# Patient Record
Sex: Female | Born: 2000
Health system: Southern US, Community
[De-identification: ages and names within clinical notes are randomized; demographics above are authoritative.]

## PROBLEM LIST (undated history)

## (undated) DIAGNOSIS — M545 Low back pain: Secondary | ICD-10-CM

## (undated) DIAGNOSIS — M419 Scoliosis, unspecified: Secondary | ICD-10-CM

## (undated) DIAGNOSIS — H7090 Unspecified mastoiditis, unspecified ear: Secondary | ICD-10-CM

## (undated) HISTORY — PX: INGUINAL HERNIA REPAIR: SUR1180

## (undated) HISTORY — DX: Unspecified mastoiditis, unspecified ear: H70.90

## (undated) HISTORY — DX: Low back pain: M54.5

## (undated) HISTORY — DX: Scoliosis, unspecified: M41.9

---
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
## 2020-03-05 ENCOUNTER — Telehealth: Payer: Self-pay

## 2020-03-05 NOTE — Telephone Encounter (Signed)
Patients mother called in needing to get the patient scheduled for some injections before going off to college one she is needing is meningococcal and the Tdap possible    Please advise

## 2020-03-05 NOTE — Telephone Encounter (Signed)
Please schedule her an appointment.  She hasn't been seen since 2019.   Thanks.

## 2020-03-20 ENCOUNTER — Encounter: Payer: Self-pay | Admitting: Family Medicine

## 2020-03-20 ENCOUNTER — Other Ambulatory Visit: Payer: Self-pay

## 2020-03-20 ENCOUNTER — Ambulatory Visit (INDEPENDENT_AMBULATORY_CARE_PROVIDER_SITE_OTHER): Payer: 59 | Admitting: Family Medicine

## 2020-03-20 VITALS — BP 130/82 | HR 82 | Temp 98.3°F | Resp 16 | Ht 71.0 in | Wt 169.6 lb

## 2020-03-20 DIAGNOSIS — Z0000 Encounter for general adult medical examination without abnormal findings: Secondary | ICD-10-CM

## 2020-03-20 DIAGNOSIS — Z23 Encounter for immunization: Secondary | ICD-10-CM | POA: Diagnosis not present

## 2020-03-20 NOTE — Addendum Note (Signed)
Addended by: Emi Holes D on: 03/20/2020 10:45 AM   Modules accepted: Orders

## 2020-03-20 NOTE — Progress Notes (Signed)
Office Note 03/20/2020  CC:  Chief Complaint  Patient presents with  . Annual Exam    pt is not fasting     HPI:  Jacqueline [name] is a 19 y.o. White female who is here accompanied by her mother Kenney Houseman for annual health maintenance exam. Leaving for [place] 04/2020 for college--[employer]ug[employer] scholarship. Exercise science and atheletic training major.  She is excited. 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 No recent injuries. No questions today.   Past Medical History:  Diagnosis Date  . Acute low back pain 2018   Responding to PT well as of 01/2017  . Mastoiditis   . Scoliosis of lumbar spine    mild levoscoliosis on L/S x-ray 11/2016.    Past Surgical History:  Procedure Laterality Date  . INGUINAL HERNIA REPAIR  approx 2010   Right side (Dr. Wyline Mood).    Family History  Problem Relation Age of Onset  . Melanoma Mother   . Lung cancer Maternal Grandmother        smoker  . Breast cancer Paternal Grandmother     Social History   Socioeconomic History  . Marital status: Single    Spouse name: Not on file  . Number of children: Not on file  . Years of education: Not on file  . Highest education level: Not on file  Occupational History  . Not on file  Tobacco Use  . Smoking status: Never Smoker  . Smokeless tobacco: Never Used  Vaping Use  . Vaping Use: Never used  Substance and Sexual Activity  . Alcohol use: No  . Drug use: No  . Sexual activity: Never    Birth control/protection: Abstinence  Other Topics Concern  . Not on file  Social History Narrative   Home schooled.   Lives with M, D, two siblings.   Social Determinants of Health   Financial Resource Strain:   . Difficulty of Paying Living Expenses:   Food Insecurity:   . Worried About Programme researcher, broadcasting/film/video in the Last Year:   . Barista in the Last Year:   Transportation Needs:   . Freight forwarder (Medical):   Marland Kitchen Lack of Transportation (Non-Medical):   Physical Activity:   .  Days of Exercise per Week:   . Minutes of Exercise per Session:   Stress:   . Feeling of Stress :   Social Connections:   . Frequency of Communication with Friends and Family:   . Frequency of Social Gatherings with Friends and Family:   . Attends Religious Services:   . Active Member of Clubs or Organizations:   . Attends Banker Meetings:   Marland Kitchen Marital Status:   Intimate Partner Violence:   . Fear of Current or Ex-Partner:   . Emotionally Abused:   Marland Kitchen Physically Abused:   . Sexually Abused:     Outpatient Medications Prior to Visit  Medication Sig Dispense Refill  . COLLAGEN PO Take by mouth daily.    Marland Kitchen ibuprofen (ADVIL,MOTRIN) 100 MG tablet Take 300 mg by mouth every 6 (six) hours as needed.    . Multiple Vitamin (MULTIVITAMIN PO) Take by mouth daily.     No facility-administered medications prior to visit.    Allergies  Allergen Reactions  . Penicillins Hives    ROS Review of Systems  Constitutional: Negative for appetite change, chills, fatigue and fever.  HENT: Negative for congestion, dental problem, ear pain and sore throat.   Eyes: Negative for discharge,  redness and visual disturbance.  Respiratory: Negative for cough, chest tightness, shortness of breath and wheezing.   Cardiovascular: Negative for chest pain, palpitations and leg swelling.  Gastrointestinal: Negative for abdominal pain, blood in stool, diarrhea, nausea and vomiting.  Genitourinary: Negative for difficulty urinating, dysuria, flank pain, frequency, hematuria and urgency.  Musculoskeletal: Negative for arthralgias, back pain, joint swelling, myalgias and neck stiffness.  Skin: Negative for pallor and rash.  Neurological: Negative for dizziness, speech difficulty, weakness and headaches.  Hematological: Negative for adenopathy. Does not bruise/bleed easily.  Psychiatric/Behavioral: Negative for confusion and sleep disturbance. The patient is not nervous/anxious.     PE; Blood  pressure 130/82, pulse 82, temperature 98.3 F (36.8 C), temperature source Temporal, resp. rate 16, height 5\' 11"  (1.803 m), weight 169 lb 9.6 oz (76.9 kg), last menstrual period 03/08/2020, SpO2 98 %. Body mass index is 23.65 kg/m.  Gen: Alert, well appearing.  Patient is oriented to person, place, time, and situation. AFFECT: pleasant, lucid thought and speech. ENT: Ears: EACs clear, normal epithelium.  TMs with good light reflex and landmarks bilaterally.  Eyes: no injection, icteris, swelling, or exudate.  EOMI, PERRLA. Nose: no drainage or turbinate edema/swelling.  No injection or focal lesion.  Mouth: lips without lesion/swelling.  Oral mucosa pink and moist.  Dentition intact and without obvious caries or gingival swelling.  Oropharynx without erythema, exudate, or swelling.  Neck: supple/nontender.  No LAD, mass, or TM.  Carotid pulses 2+ bilaterally, without bruits. CV: RRR, no m/r/g.   LUNGS: CTA bilat, nonlabored resps, good aeration in all lung fields. ABD: soft, NT, ND, BS normal.  No hepatospenomegaly or mass.  No bruits. EXT: no clubbing, cyanosis, or edema.  Musculoskeletal: no joint swelling, erythema, warmth, or tenderness.  ROM of all joints intact. Skin - no sores or suspicious lesions or rashes or color changes   Pertinent labs:  NONE  ASSESSMENT AND PLAN:   Health maintenance exam: Reviewed age and gender appropriate health maintenance issues (prudent diet, regular exercise, health risks of tobacco and excessive alcohol, use of seatbelts, fire alarms in home, use of sunscreen).  Also reviewed age and gender appropriate health screening as well as vaccine recommendations. Vaccines: menveo (#2) today.  Records show only 1 varicella vaccine.  However, pt and mom state that her school health forms don't show this being a required vaccine but they'll get back to 03/10/2020 if they want her to get varicella vaccine #2 OR test for immunity. Labs: none Cervical ca screening:  recommended start pap/pelvic exams age 79 yrs.  An After Visit Summary was printed and given to the patient.  FOLLOW UP:  Return in about 1 year (around 03/20/2021) for annual physical exam.  Signed:  05/21/2021, MD           03/20/2020

## 2020-03-20 NOTE — Patient Instructions (Signed)
Health Maintenance, Female Adopting a healthy lifestyle and getting preventive care are important in promoting health and wellness. Ask your health care provider about:  The right schedule for you to have regular tests and exams.  Things you can do on your own to prevent diseases and keep yourself healthy. What should I know about diet, weight, and exercise? Eat a healthy diet   Eat a diet that includes plenty of vegetables, fruits, low-fat dairy products, and lean protein.  Do not eat a lot of foods that are high in solid fats, added sugars, or sodium. Maintain a healthy weight Body mass index (BMI) is used to identify weight problems. It estimates body fat based on height and weight. Your health care provider can help determine your BMI and help you achieve or maintain a healthy weight. Get regular exercise Get regular exercise. This is one of the most important things you can do for your health. Most adults should:  Exercise for at least 150 minutes each week. The exercise should increase your heart rate and make you sweat (moderate-intensity exercise).  Do strengthening exercises at least twice a week. This is in addition to the moderate-intensity exercise.  Spend less time sitting. Even light physical activity can be beneficial. Watch cholesterol and blood lipids Have your blood tested for lipids and cholesterol at 19 years of age, then have this test every 5 years. Have your cholesterol levels checked more often if:  Your lipid or cholesterol levels are high.  You are older than 19 years of age.  You are at high risk for heart disease. What should I know about cancer screening? Depending on your health history and family history, you may need to have cancer screening at various ages. This may include screening for:  Breast cancer.  Cervical cancer.  Colorectal cancer.  Skin cancer.  Lung cancer. What should I know about heart disease, diabetes, and high blood  pressure? Blood pressure and heart disease  High blood pressure causes heart disease and increases the risk of stroke. This is more likely to develop in people who have high blood pressure readings, are of African descent, or are overweight.  Have your blood pressure checked: ? Every 3-5 years if you are 19-39 years of age. ? Every year if you are 40 years old or older. Diabetes Have regular diabetes screenings. This checks your fasting blood sugar level. Have the screening done:  Once every three years after age 40 if you are at a normal weight and have a low risk for diabetes.  More often and at a younger age if you are overweight or have a high risk for diabetes. What should I know about preventing infection? Hepatitis B If you have a higher risk for hepatitis B, you should be screened for this virus. Talk with your health care provider to find out if you are at risk for hepatitis B infection. Hepatitis C Testing is recommended for:  Everyone born from 1945 through 1965.  Anyone with known risk factors for hepatitis C. Sexually transmitted infections (STIs)  Get screened for STIs, including gonorrhea and chlamydia, if: ? You are sexually active and are younger than 19 years of age. ? You are older than 19 years of age and your health care provider tells you that you are at risk for this type of infection. ? Your sexual activity has changed since you were last screened, and you are at increased risk for chlamydia or gonorrhea. Ask your health care provider if   you are at risk.  Ask your health care provider about whether you are at high risk for HIV. Your health care provider may recommend a prescription medicine to help prevent HIV infection. If you choose to take medicine to prevent HIV, you should first get tested for HIV. You should then be tested every 3 months for as long as you are taking the medicine. Pregnancy  If you are about to stop having your period (premenopausal) and  you may become pregnant, seek counseling before you get pregnant.  Take 400 to 800 micrograms (mcg) of folic acid every day if you become pregnant.  Ask for birth control (contraception) if you want to prevent pregnancy. Osteoporosis and menopause Osteoporosis is a disease in which the bones lose minerals and strength with aging. This can result in bone fractures. If you are 65 years old or older, or if you are at risk for osteoporosis and fractures, ask your health care provider if you should:  Be screened for bone loss.  Take a calcium or vitamin D supplement to lower your risk of fractures.  Be given hormone replacement therapy (HRT) to treat symptoms of menopause. Follow these instructions at home: Lifestyle  Do not use any products that contain nicotine or tobacco, such as cigarettes, e-cigarettes, and chewing tobacco. If you need help quitting, ask your health care provider.  Do not use street drugs.  Do not share needles.  Ask your health care provider for help if you need support or information about quitting drugs. Alcohol use  Do not drink alcohol if: ? Your health care provider tells you not to drink. ? You are pregnant, may be pregnant, or are planning to become pregnant.  If you drink alcohol: ? Limit how much you use to 0-1 drink a day. ? Limit intake if you are breastfeeding.  Be aware of how much alcohol is in your drink. In the [place] one drink equals one 12 oz bottle of beer (355 mL), one 5 oz glass of wine (148 mL), or one 1 oz glass of hard liquor (44 mL). General instructions  Schedule regular health, dental, and eye exams.  Stay current with your vaccines.  Tell your health care provider if: ? You often feel depressed. ? You have ever been abused or do not feel safe at home. Summary  Adopting a healthy lifestyle and getting preventive care are important in promoting health and wellness.  Follow your health care provider's instructions about healthy  diet, exercising, and getting tested or screened for diseases.  Follow your health care provider's instructions on monitoring your cholesterol and blood pressure. This information is not intended to replace advice given to you by your health care provider. Make sure you discuss any questions you have with your health care provider. Document Revised: 08/24/2018 Document Reviewed: 08/24/2018 Elsevier Patient Education  2020 Elsevier Inc.  

## 2020-03-27 ENCOUNTER — Telehealth: Payer: Self-pay

## 2020-03-27 NOTE — Telephone Encounter (Signed)
OK, as long as the only symptoms are hives at/around the injection site then the only treatment necessary is 25mg  benadryl every 6 hours as needed.  If other sx's such as wheezing, SOB, n/v/d, or swelling in face/tongue/throat then let me know.-thx

## 2020-03-27 NOTE — Telephone Encounter (Signed)
Spoke with patient's mother, okay per dpr. Patient is experiencing swelling in her fingers and toes. She is also still feeling itchy all over and has been taking benadyrl. Any other suggestions? Please advise, thanks.

## 2020-03-27 NOTE — Telephone Encounter (Signed)
Mom states that daughter has hives and rash at injection site. She was seen last week meningococcal injection.  Please call mom [phone]

## 2020-03-27 NOTE — Telephone Encounter (Signed)
Patient was seen for CPE last week and given menveo. She is having reaction to injection.   Please advise, thanks.

## 2020-03-27 NOTE — Telephone Encounter (Signed)
Nothing else to add, unfortunately.

## 2020-03-28 NOTE — Telephone Encounter (Signed)
Pt's mom, Archie Patten advised.

## 2020-04-26 ENCOUNTER — Telehealth: Payer: Self-pay | Admitting: Family Medicine

## 2020-04-26 NOTE — Telephone Encounter (Signed)
Form given to PCP for completion.

## 2020-04-26 NOTE — Telephone Encounter (Signed)
Patient's mom emailed me a form for college, states that she did not have with her at the physical and was told she would be able to bring them by to be filled out. Mom does state that she is needing it by Monday afternoon if at all possible. Forms have been placed in folder up front

## 2020-04-29 NOTE — Telephone Encounter (Signed)
Mom called about sports physical form for college that was left last week.  It is ready for pick up.  She will come by office to pick up soon.

## 2020-04-29 NOTE — Telephone Encounter (Signed)
Signed and put in box to go up front. Signed:  Santiago Bumpers, MD           04/29/2020

## 2020-08-06 ENCOUNTER — Telehealth: Payer: Self-pay

## 2020-08-06 NOTE — Telephone Encounter (Signed)
-----   Message from Jeoffrey Massed, MD sent at 08/06/2020 12:57 PM EST ----- Regarding: RE: COVID Vaccine Yes, ok to get covid vaccine. ----- Message ----- From: Maxie Barb, CMA Sent: 08/06/2020   8:19 AM EST To: Jeoffrey Massed, MD Subject: COVID Vaccine                                  Pt mother would like to know if it is safe for pt to receive COVID vaccine pt had a reaction with hand swelling and elevated HR with meningococcal vaccine. Pt do not normally receive flu vaccine and had not had any reaction to any other vaccines in the past.

## 2020-08-06 NOTE — Telephone Encounter (Signed)
Pt mother aware.

## 2020-08-26 ENCOUNTER — Encounter: Payer: Self-pay | Admitting: Family Medicine

## 2020-08-26 ENCOUNTER — Telehealth (INDEPENDENT_AMBULATORY_CARE_PROVIDER_SITE_OTHER): Payer: 59 | Admitting: Family Medicine

## 2020-08-26 VITALS — Temp 100.0°F | Ht 71.0 in | Wt 170.0 lb

## 2020-08-26 DIAGNOSIS — R059 Cough, unspecified: Secondary | ICD-10-CM

## 2020-08-26 DIAGNOSIS — R5081 Fever presenting with conditions classified elsewhere: Secondary | ICD-10-CM

## 2020-08-26 DIAGNOSIS — J209 Acute bronchitis, unspecified: Secondary | ICD-10-CM | POA: Diagnosis not present

## 2020-08-26 DIAGNOSIS — J069 Acute upper respiratory infection, unspecified: Secondary | ICD-10-CM

## 2020-08-26 DIAGNOSIS — G933 Postviral fatigue syndrome: Secondary | ICD-10-CM

## 2020-08-26 DIAGNOSIS — R062 Wheezing: Secondary | ICD-10-CM | POA: Diagnosis not present

## 2020-08-26 DIAGNOSIS — G9331 Postviral fatigue syndrome: Secondary | ICD-10-CM

## 2020-08-26 MED ORDER — ALBUTEROL SULFATE HFA 108 (90 BASE) MCG/ACT IN AERS: 2.0000 | INHALATION_SPRAY | RESPIRATORY_TRACT | 0 refills | Status: DC | PRN

## 2020-08-26 MED ORDER — PREDNISONE 20 MG PO TABS: ORAL_TABLET | ORAL | 0 refills | Status: DC

## 2020-08-26 NOTE — Progress Notes (Signed)
Virtual Visit via Video Note  I connected with pt on 08/26/20 at  4:00 PM EST by a video enabled telemedicine application and verified that I am speaking with the correct person using two identifiers.  Location patient: home,  Location provider:work or home office Persons participating in the virtual visit: patient, provider  I discussed the limitations of evaluation and management by telemedicine and the availability of in person appointments. The patient expressed understanding and agreed to proceed.   HPI: 19 y/o WF being seen today for cough. Describes influenza-like illness a few months ago, had cough since that time---sounds like bronchospastic-type cough from what she describes.  Worse when trying to run/play sports.  ? Tight chest intermittently. Now signif Inc cough x 2-3d, then last night eyes hurting, headache, stuffy and runny nose, ST (she thinks only from drainage).  T 102 last night.  T100 this morning, T 100 now.  No SOB or DOE or CP.  No n/v/d. Taking mucinex plain and tylenol. More coughing when deep breathing and lying down.   ROS: See pertinent positives and negatives per HPI.  Past Medical History:  Diagnosis Date  . Acute low back pain 2018   Responding to PT well as of 01/2017  . Mastoiditis   . Scoliosis of lumbar spine    mild levoscoliosis on L/S x-ray 11/2016.    Past Surgical History:  Procedure Laterality Date  . INGUINAL HERNIA REPAIR  approx 2010   Right side (Dr. Wyline Mood).     Current Outpatient Medications:  .  COLLAGEN PO, Take by mouth daily., Disp: , Rfl:  .  Multiple Vitamin (MULTIVITAMIN PO), Take by mouth daily., Disp: , Rfl:  .  ibuprofen (ADVIL,MOTRIN) 100 MG tablet, Take 300 mg by mouth every 6 (six) hours as needed. (Patient not taking: Reported on 08/26/2020), Disp: , Rfl:   EXAM:  VITALS per patient if applicable:  Vitals with BMI 08/26/2020 03/20/2020 11/23/2017  Height 5\' 11"  5\' 11"  5\' 10"   Weight 170 lbs 169 lbs 10 oz 155 lbs 4  oz  BMI 23.72 23.66 22.28  Systolic - 130 125  Diastolic - 82 63  Pulse - 82 66  Temp today is 100.   GENERAL: alert, oriented, appears well and in no acute distress  HEENT: atraumatic, conjunttiva clear, no obvious abnormalities on inspection of external nose and ears Very stuffy nose, sniffling a lot.  NECK: normal movements of the head and neck  LUNGS: on inspection no signs of respiratory distress, breathing rate appears normal, no obvious gross SOB, gasping or wheezing  CV: no obvious cyanosis  MS: moves all visible extremities without noticeable abnormality  PSYCH/NEURO: pleasant and cooperative, no obvious depression or anxiety, speech and thought processing grossly intact  ASSESSMENT AND PLAN:  Discussed the following assessment and plan:  Postinfectious/postinflammatory RAD/cough--->now with superimposed acute URI with cough/congestion and fever. Will treat with prednisone 40mg  qd x 5d, then 20mg  qd x 5d. Also, albuterol HFA 2p q4h prn. Continue mucinex, start saline nasal spray, continue tylenol or motrin q6h. Rest, fluids, quarantine as appropriate.  -we discussed possible serious and likely etiologies, options for evaluation and workup, limitations of telemedicine visit vs in person visit, treatment, treatment risks and precautions. Pt prefers to treat via telemedicine empirically rather than in person at this moment.    I discussed the assessment and treatment plan with the patient. The patient was provided an opportunity to ask questions and all were answered. The patient agreed with the plan and  demonstrated an understanding of the instructions.    F/u: 3-4 wks, mainly to discuss any ongoing w/u and /or mgmt for her postinfectious/postinflamm cough/RAD.  Signed:  Santiago Bumpers, MD           08/26/2020

## 2020-08-28 ENCOUNTER — Telehealth: Payer: Self-pay | Admitting: Family Medicine

## 2020-08-28 MED ORDER — BENZONATATE 200 MG PO CAPS: ORAL_CAPSULE | ORAL | 0 refills | Status: DC

## 2020-08-28 MED ORDER — AZITHROMYCIN 250 MG PO TABS: ORAL_TABLET | ORAL | 0 refills | Status: DC

## 2020-08-28 NOTE — Telephone Encounter (Signed)
Patient's mother states Jacqueline [name] still has fever,  wheezing and coughing to the point of vomiting. Patient has been getting the inhaler and the prednisone. Mother says it sounds like infection going into the patient's lungs and thinks she needs an antibiotic. Please call patient's mother to advise.

## 2020-08-28 NOTE — Telephone Encounter (Signed)
Spoke with patient's mother and advised of instructions/recommendations.

## 2020-08-28 NOTE — Telephone Encounter (Signed)
Reassure. I agree that she has infection-->very likely a virus BUT I have sent just sent in abx to see if this helps. Also, I just rx'd tessalon pearls for cough---ok to stop mucinex. No other changes.

## 2020-08-28 NOTE — Telephone Encounter (Signed)
Patient was last seen 12/13, given the following instructions: "Will treat with prednisone 40mg  qd x 5d, then 20mg  qd x 5d. Also, albuterol HFA 2p q4h prn. Continue mucinex, start saline nasal spray, continue tylenol or motrin q6h. Rest, fluids, quarantine as appropriate". Spoke with mom, she is still running 99-100 low grade fever. Lungs hurt, burning and rattling in the chest. They have doing everything recommended and still taking medications as prescribed as well but not working well. She has not slept in 3 days  Please advise, thanks.

## 2020-09-16 ENCOUNTER — Encounter: Payer: Self-pay | Admitting: Family Medicine

## 2020-09-16 ENCOUNTER — Encounter: Payer: 59 | Admitting: Family Medicine

## 2020-09-16 NOTE — Progress Notes (Deleted)
Virtual Visit via Video Note  I connected with pt on 09/16/20 at  8:30 AM EST by a video enabled telemedicine application and verified that I am speaking with the correct person using two identifiers.  Location patient: home, [place] Location provider:work or home office Persons participating in the virtual visit: patient, provider  I discussed the limitations of evaluation and management by telemedicine and the availability of in person appointments. The patient expressed understanding and agreed to proceed.  Telemedicine visit is a necessity given the COVID-19 restrictions in place at the current time.  HPI: 20 y/o WF being seen today for f/u cough. I last saw her almost 3 wks ago: A/P as of that visit: "Postinfectious/postinflammatory RAD/cough--->now with superimposed acute URI with cough/congestion and fever. Will treat with prednisone 40mg  qd x 5d, then 20mg  qd x 5d. Also, albuterol HFA 2p q4h prn. Continue mucinex, start saline nasal spray, continue tylenol or motrin q6h. Rest, fluids, quarantine as appropriate."  INTERIM HX: I ended up calling in abx a couple days after I saw her last. ***  ROS: See pertinent positives and negatives per HPI.  Past Medical History:  Diagnosis Date  . Acute low back pain 2018   Responding to PT well as of 01/2017  . Mastoiditis   . Scoliosis of lumbar spine    mild levoscoliosis on L/S x-ray 11/2016.    Past Surgical History:  Procedure Laterality Date  . INGUINAL HERNIA REPAIR  approx 2010   Right side (Dr. 12/2016).     Current Outpatient Medications:  .  albuterol (VENTOLIN HFA) 108 (90 Base) MCG/ACT inhaler, Inhale 2 puffs into the lungs every 4 (four) hours as needed for wheezing or shortness of breath., Disp: 1 each, Rfl: 0 .  azithromycin (ZITHROMAX) 250 MG tablet, 2 tabs po qd x 1d, then 1 tab po qd x 4d, Disp: 6 tablet, Rfl: 0 .  benzonatate (TESSALON) 200 MG capsule, 1 cap po tid prn cough, Disp: 20 capsule, Rfl: 0 .  COLLAGEN  PO, Take by mouth daily., Disp: , Rfl:  .  ibuprofen (ADVIL,MOTRIN) 100 MG tablet, Take 300 mg by mouth every 6 (six) hours as needed. (Patient not taking: Reported on 08/26/2020), Disp: , Rfl:  .  Multiple Vitamin (MULTIVITAMIN PO), Take by mouth daily., Disp: , Rfl:  .  predniSONE (DELTASONE) 20 MG tablet, 2 tabs po qd x 5d, then 1 tab po qd x 5d, Disp: 15 tablet, Rfl: 0  EXAM:  VITALS per patient if applicable:  GENERAL: alert, oriented, appears well and in no acute distress  HEENT: atraumatic, conjunttiva clear, no obvious abnormalities on inspection of external nose and ears  NECK: normal movements of the head and neck  LUNGS: on inspection no signs of respiratory distress, breathing rate appears normal, no obvious gross SOB, gasping or wheezing  CV: no obvious cyanosis  MS: moves all visible extremities without noticeable abnormality  PSYCH/NEURO: pleasant and cooperative, no obvious depression or anxiety, speech and thought processing grossly intact  LABS: none today  ASSESSMENT AND PLAN:  Discussed the following assessment and plan:  No diagnosis found.  I discussed the assessment and treatment plan with the patient. The patient was provided an opportunity to ask questions and all were answered. The patient agreed with the plan and demonstrated an understanding of the instructions.     Signed:  Wyline Mood, MD           09/16/2020

## 2021-06-26 ENCOUNTER — Encounter: Payer: Self-pay | Admitting: Family Medicine

## 2021-06-26 ENCOUNTER — Ambulatory Visit: Payer: 59 | Admitting: Family Medicine

## 2021-06-26 ENCOUNTER — Other Ambulatory Visit: Payer: Self-pay

## 2021-06-26 VITALS — BP 108/75 | HR 79 | Temp 97.6°F | Ht 71.0 in | Wt 174.4 lb

## 2021-06-26 DIAGNOSIS — F411 Generalized anxiety disorder: Secondary | ICD-10-CM | POA: Diagnosis not present

## 2021-06-26 DIAGNOSIS — F4323 Adjustment disorder with mixed anxiety and depressed mood: Secondary | ICD-10-CM | POA: Diagnosis not present

## 2021-06-26 MED ORDER — LORAZEPAM 0.5 MG PO TABS: 0.5000 mg | ORAL_TABLET | Freq: Two times a day (BID) | ORAL | 0 refills | Status: DC | PRN

## 2021-06-26 MED ORDER — DULOXETINE HCL 20 MG PO CPEP: 20.0000 mg | ORAL_CAPSULE | Freq: Every day | ORAL | 0 refills | Status: DC

## 2021-06-26 NOTE — Progress Notes (Signed)
OFFICE VISIT  06/26/2021  CC:  Chief Complaint  Patient presents with   Anxiety    Accompanied by mother   HPI:    Patient is a 20 y.o. female who presents accompanied by her mother Archie Patten for anxiety.  INTERIM HX: Unfortunately Katherine's mom and dad were in a bad motor vehicle accident 3 months ago.  Her father was killed and her mother was severely injured but is recovering. She has always struggled with generalized anxiety but has always been able to cope adequately in the past.  She seeks care today because the last few months have been extremely hard, understandably.  Says she feels overwhelmed. She has anxiety to the point of being unable to push herself to do anything at all.  Afraid to go out, does not eat, is sleeping maybe 2 hours a night, often feels a sense of panic.  Describes her mood as sad, but then says not depressed.  Her close friends are back and is very but her family is close and she has good support. She tried to go back to school this fall in [place] but did not last long because she could not function with the responsibilities of school, work, and playing rugby.  She has never been on any medication for anxiety or depression. She has an appointment set up with a counselor through [facility] behavioral [facility] but it is about a month from now and she is hoping I can help her get 1 was someone earlier.  Past Medical History:  Diagnosis Date   Acute low back pain 2018   Responding to PT well as of 01/2017   Mastoiditis    Scoliosis of lumbar spine    mild levoscoliosis on L/S x-ray 11/2016.    Past Surgical History:  Procedure Laterality Date   INGUINAL HERNIA REPAIR  approx 2010   Right side (Dr. Wyline Mood).    Outpatient Medications Prior to Visit  Medication Sig Dispense Refill   ibuprofen (ADVIL,MOTRIN) 100 MG tablet Take 300 mg by mouth every 6 (six) hours as needed.     Multiple Vitamin (MULTIVITAMIN PO) Take by mouth daily.     albuterol (VENTOLIN HFA)  108 (90 Base) MCG/ACT inhaler Inhale 2 puffs into the lungs every 4 (four) hours as needed for wheezing or shortness of breath. (Patient not taking: Reported on 06/26/2021) 1 each 0   benzonatate (TESSALON) 200 MG capsule 1 cap po tid prn cough (Patient not taking: Reported on 06/26/2021) 20 capsule 0   COLLAGEN PO Take by mouth daily. (Patient not taking: Reported on 06/26/2021)     predniSONE (DELTASONE) 20 MG tablet 2 tabs po qd x 5d, then 1 tab po qd x 5d (Patient not taking: Reported on 06/26/2021) 15 tablet 0   No facility-administered medications prior to visit.    Allergies  Allergen Reactions   Penicillins Hives    ROS As per HPI  PE: Vitals with BMI 06/26/2021 08/26/2020 03/20/2020  Height 5\' 11"  5\' 11"  5\' 11"   Weight 174 lbs 6 oz 170 lbs 169 lbs 10 oz  BMI 24.33 23.72 23.66  Systolic 108 - 130  Diastolic 75 - 82  Pulse 79 - 82    General: Alert, attends well.  She is blunted, sad.  Not crying.  Often looks to her mother to help her answer questions. No further exam today  LABS:  none  IMPRESSION AND PLAN:  GAD, with superimposed adjustment disorder with mixed anxious and depressed mood.  She has  panic attacks.  We discussed medications in depth today and she was quite apprehensive about them, particularly antidepressants.  We talked through the pros and cons and in the end she decided to go ahead and make a plan to start meds and see how things go. Prescribed duloxetine 20 mg, 1/day, #30, no refill. Prescribed lorazepam 0.5 mg, 1 twice daily as needed for severe anxiety, #30, no refill. Therapeutic expectations and side effect profile of medication discussed today.  Patient's questions answered. I have ordered behavioral health evaluation, focusing efforts on trying to get her an appointment within the next 2 weeks.  I think just having this appointment in the near future, rather than a month away, will give her some optimism towards getting better.  Spent 30 min with  pt today reviewing HPI, reviewing relevant past history, doing exam, reviewing and discussing lab and imaging data, and formulating plans.   An After Visit Summary was printed and given to the patient.  FOLLOW UP: No follow-ups on file.  Signed:  Santiago Bumpers, MD           06/26/2021

## 2021-06-30 ENCOUNTER — Telehealth (INDEPENDENT_AMBULATORY_CARE_PROVIDER_SITE_OTHER): Payer: 59 | Admitting: Psychiatry

## 2021-06-30 ENCOUNTER — Encounter (HOSPITAL_COMMUNITY): Payer: Self-pay | Admitting: Psychiatry

## 2021-06-30 DIAGNOSIS — F4321 Adjustment disorder with depressed mood: Secondary | ICD-10-CM

## 2021-06-30 DIAGNOSIS — F321 Major depressive disorder, single episode, moderate: Secondary | ICD-10-CM

## 2021-06-30 DIAGNOSIS — F410 Panic disorder [episodic paroxysmal anxiety] without agoraphobia: Secondary | ICD-10-CM

## 2021-06-30 MED ORDER — BUSPIRONE HCL 7.5 MG PO TABS: 7.5000 mg | ORAL_TABLET | Freq: Every day | ORAL | 0 refills | Status: DC

## 2021-06-30 MED ORDER — MIRTAZAPINE 7.5 MG PO TABS
7.5000 mg | ORAL_TABLET | Freq: Every day | ORAL | 0 refills | Status: DC
Start: 2021-06-30 — End: 2021-07-18

## 2021-06-30 NOTE — Progress Notes (Signed)
Psychiatric Initial Adult Assessment   Patient Identification: Jacqueline [name] MRN:  [id number] Date of Evaluation:  06/30/2021 Referral Source: Primary care Chief Complaint:  establish care, anxiety, grief Visit Diagnosis:    ICD-10-CM   1. Current moderate episode of major depressive disorder without prior episode (HCC)  F32.1     2. Panic attacks  F41.0     3. Grief  F43.21      Virtual Visit via Video Note  I connected with Jacqueline [name] on 06/30/21 at  2:00 PM EDT by a video enabled telemedicine application and verified that I am speaking with the correct person using two identifiers.  Location: Patient: home Provider: home office   I discussed the limitations of evaluation and management by telemedicine and the availability of in person appointments. The patient expressed understanding and agreed to proceed.      I discussed the assessment and treatment plan with the patient. The patient was provided an opportunity to ask questions and all were answered. The patient agreed with the plan and demonstrated an understanding of the instructions.   The patient was advised to call back or seek an in-person evaluation if the symptoms worsen or if the condition fails to improve as anticipated.  I provided 45 minutes of non-face-to-face time during this encounter.   Thresa Ross, MD  History of Present Illness: Patient is a 20 years old currently single Caucasian female referred by primary care physician office to establish care for anxiety she currently lives with her sister mom and brother.  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  Her dad was a IT sales professional it was a Primary school teacher.  She adopted and did not have the process to go through grief considering her mom was in ICU as well she developed fear and anxiety panic-like attacks when she would go out and recently has also been feeling down subdued decreased appetite poor sleep.  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  There is no associated psychotic symptoms or manic symptoms  Aggravating factor; dad death in accdient,  parents accident. Modifying factors; her sister, her brother  Duration since July this year   Past Psychiatric History: denies  Previous Psychotropic Medications: No   Substance Abuse History in the last 12 months:  No.  Consequences of Substance Abuse: NA  Past Medical History:  Past Medical History:  Diagnosis Date   Acute low back pain 2018   Responding  to PT well as of 01/2017   Mastoiditis    Scoliosis of lumbar spine    mild levoscoliosis on L/S x-ray 11/2016.    Past Surgical History:  Procedure Laterality Date   INGUINAL HERNIA REPAIR  approx 2010   Right side (Dr. Wyline Mood).    Family Psychiatric History: substance use among family, aunt, uncle  Family History:  Family History  Problem Relation Age of Onset   Melanoma Mother    Lung cancer Maternal Grandmother        smoker   Breast cancer Paternal Grandmother     Social History:   Social History    Socioeconomic History   Marital status: Single    Spouse name: Not on file   Number of children: Not on file   Years of education: Not on file   Highest education level: Not on file  Occupational History   Not on file  Tobacco Use   Smoking status: Never   Smokeless tobacco: Never  Vaping Use   Vaping Use: Never used  Substance and Sexual Activity   Alcohol use: No   Drug use: No   Sexual activity: Never    Birth control/protection: Abstinence  Other Topics Concern   Not on file  Social History Narrative   Home schooled.   Lives with M, D, two siblings.   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 30 tablet 0   mirtazapine (REMERON) 7.5 MG tablet Take 1 tablet (7.5 mg total) by mouth at bedtime. 30 tablet 0   albuterol (VENTOLIN HFA) 108 (90 Base) MCG/ACT inhaler Inhale 2 puffs into the lungs every 4 (four) hours as needed for wheezing or shortness of breath. (Patient not taking: Reported on 06/26/2021) 1 each 0   DULoxetine (CYMBALTA) 20 MG capsule Take 1 capsule (20 mg total) by mouth daily. 30 capsule 0   ibuprofen (ADVIL,MOTRIN) 100  MG tablet Take 300 mg by mouth every 6 (six) hours as needed.     LORazepam (ATIVAN) 0.5 MG tablet Take 1 tablet (0.5 mg total) by mouth 2 (two) times daily as needed for anxiety. 30 tablet 0   Multiple Vitamin (MULTIVITAMIN PO) Take by mouth daily.     No current facility-administered medications for this visit.     Psychiatric Specialty Exam: Review of Systems  Cardiovascular:  Negative for chest pain.  Psychiatric/Behavioral:  Positive for dysphoric mood and sleep disturbance. Negative for self-injury. The patient is nervous/anxious.    There were no vitals taken for this visit.There is no height or weight on file to calculate BMI.  General Appearance: Casual  Eye Contact:  Fair  Speech:  Normal Rate  Volume:  Decreased  Mood:   subdued  Affect:  Constricted  Thought Process:  Goal Directed  Orientation:  Full (Time, Place, and Person)  Thought Content:  Rumination  Suicidal Thoughts:  No  Homicidal Thoughts:  No  Memory:  Recent;   Fair  Judgement:  Intact  Insight:  Present  Psychomotor Activity:  Decreased  Concentration:  Concentration: Fair  Recall:  Good  Fund of Knowledge:Good  Language: Good  Akathisia:  No  Handed:    AIMS (if indicated):  not done  Assets:  Architect Housing Physical Health  ADL's:  Intact  Cognition: WNL  Sleep:   variable to fair45   Screenings: GAD-7    Flowsheet Row Office Visit from 06/26/2021 in [facility]  Total GAD-7 Score 21      PHQ2-9    Flowsheet Row Video Visit from 06/30/2021 in [facility] [place] Office Visit from 06/26/2021 in [facility] Visit from 03/20/2020 in [facility] Total Score 2 4 0  PHQ-9 Total Score 11 22 --      Flowsheet Row Video Visit from 06/30/2021 in [facility] [place]  C-SSRS RISK CATEGORY No Risk        Assessment and Plan: As follows  Major depressive disorder moderate to severe with no prior episode; she has started Cymbalta 20 mg she can continue I will add Remeron a small dose of 7.5 mg to help with sleep and also for depression considering her losing appetite as well  Anxiety disorder; panic attacks; continue Cymbalta 20 mg add BuSpar 7.5 mg as she is reluctant to take Ativan and she understands the risk.  She is going to start therapy with Fernand Parkins tomorrow highly encouraged to process her grief and anxiety with the therapist  Grief; ongoing grief with depressive symptoms as above continue Cymbalta add Remeron and therapy she is scheduled for tomorrow  She does have a good support system with her sister and is still taking care of her mom who was in a wheelchair and has had broken bones collapsed lung and brain damage   Follow-up in 3 weeks or earlier if needed reviewed medications provided supportive therapy Thresa Ross, MD 10/17/20222:29 PM

## 2021-07-18 ENCOUNTER — Encounter (HOSPITAL_COMMUNITY): Payer: Self-pay | Admitting: Psychiatry

## 2021-07-18 ENCOUNTER — Telehealth (INDEPENDENT_AMBULATORY_CARE_PROVIDER_SITE_OTHER): Payer: 59 | Admitting: Psychiatry

## 2021-07-18 DIAGNOSIS — F4321 Adjustment disorder with depressed mood: Secondary | ICD-10-CM

## 2021-07-18 DIAGNOSIS — F410 Panic disorder [episodic paroxysmal anxiety] without agoraphobia: Secondary | ICD-10-CM

## 2021-07-18 DIAGNOSIS — F321 Major depressive disorder, single episode, moderate: Secondary | ICD-10-CM | POA: Diagnosis not present

## 2021-07-18 MED ORDER — DULOXETINE HCL 20 MG PO CPEP: 20.0000 mg | ORAL_CAPSULE | Freq: Every day | ORAL | 1 refills | Status: DC

## 2021-07-18 MED ORDER — MIRTAZAPINE 7.5 MG PO TABS: 7.5000 mg | ORAL_TABLET | Freq: Every day | ORAL | 1 refills | Status: DC

## 2021-07-18 MED ORDER — BUSPIRONE HCL 7.5 MG PO TABS: 7.5000 mg | ORAL_TABLET | Freq: Every day | ORAL | 1 refills | Status: DC

## 2021-07-18 NOTE — Progress Notes (Signed)
[facility] Follow up visit  Patient Identification: Jacqueline [name] MRN:  [id number] Date of Evaluation:  07/18/2021 Referral Source: Primary care Chief Complaint:  follow up anxiety, grief Visit Diagnosis:    ICD-10-CM   1. Current moderate episode of major depressive disorder without prior episode (HCC)  F32.1     2. Panic attacks  F41.0     3. Grief  F43.21      Virtual Visit via Video Note  I connected with Jacqueline [name] on 07/18/21 at  9:00 AM EDT by a video enabled telemedicine application and verified that I am speaking with the correct person using two identifiers.  Location: Patient: home Provider: home office   I discussed the limitations of evaluation and management by telemedicine and the availability of in person appointments. The patient expressed understanding and agreed to proceed.     I discussed the assessment and treatment plan with the patient. The patient was provided an opportunity to ask questions and all were answered. The patient agreed with the plan and demonstrated an understanding of the instructions.   The patient was advised to call back or seek an in-person evaluation if the symptoms worsen or if the condition fails to improve as anticipated.  I provided 10 minutes of non-face-to-face time during this encounter.     History of Present Illness: Patient is a 20 years old currently single Caucasian female referred initially by primary care physician office to establish care for anxiety she currently lives with her sister mom and brother.  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    Past Surgical History:  Procedure Laterality Date   INGUINAL HERNIA REPAIR  approx 2010   Right side (Dr. Wyline Mood).    Family Psychiatric History: substance use among family, aunt, uncle  Family History:  Family History  Problem Relation Age of Onset   Melanoma Mother    Lung cancer Maternal Grandmother        smoker   Breast cancer Paternal Grandmother     Social History:   Social History   Socioeconomic History   Marital status: Single    Spouse name: Not on file   Number of children: Not on file   Years of education: Not on file   Highest education level: Not on file  Occupational History   Not on file  Tobacco Use   Smoking status: Never  Smokeless tobacco: Never  Vaping Use   Vaping Use: Never used  Substance and Sexual Activity   Alcohol use: No   Drug use: No   Sexual activity: Never    Birth control/protection: Abstinence  Other Topics Concern   Not on file  Social History Narrative   Home schooled.   Lives with M, D, two siblings.   Social Determinants of Health   Financial Resource Strain: Not on file  Food Insecurity: Not on file  Transportation Needs: Not on file  Physical Activity: Not on file  Stress: Not on file  Social Connections: Not on file     Allergies:   Allergies  Allergen Reactions   Penicillins Hives    Metabolic Disorder  Labs: No results found for: HGBA1C, MPG No results found for: PROLACTIN No results found for: CHOL, TRIG, HDL, CHOLHDL, VLDL, LDLCALC No results found for: TSH  Therapeutic Level Labs: No results found for: LITHIUM No results found for: CBMZ No results found for: VALPROATE  Current Medications: Current Outpatient Medications  Medication Sig Dispense Refill   albuterol (VENTOLIN HFA) 108 (90 Base) MCG/ACT inhaler Inhale 2 puffs into the lungs every 4 (four) hours as needed for wheezing or shortness of breath. (Patient not taking: Reported on 06/26/2021) 1 each 0   busPIRone (BUSPAR) 7.5 MG tablet Take 1 tablet (7.5 mg total) by mouth daily. 30 tablet 1   DULoxetine (CYMBALTA) 20 MG capsule Take 1 capsule (20 mg total) by mouth daily. 30 capsule 1   ibuprofen (ADVIL,MOTRIN) 100 MG tablet Take 300 mg by mouth every 6 (six) hours as needed.     LORazepam (ATIVAN) 0.5 MG tablet Take 1 tablet (0.5 mg total) by mouth 2 (two) times daily as needed for anxiety. 30 tablet 0   mirtazapine (REMERON) 7.5 MG tablet Take 1 tablet (7.5 mg total) by mouth at bedtime. 30 tablet 1   Multiple Vitamin (MULTIVITAMIN PO) Take by mouth daily.     No current facility-administered medications for this visit.     Psychiatric Specialty Exam: Review of Systems  Cardiovascular:  Negative for chest pain.  Psychiatric/Behavioral:  Positive for sleep disturbance. Negative for self-injury. The patient is nervous/anxious.    There were no vitals taken for this visit.There is no height or weight on file to calculate BMI.  General Appearance: Casual  Eye Contact:  Fair  Speech:  Normal Rate  Volume:  Decreased  Mood: better  Affect:  Constricted  Thought Process:  Goal Directed  Orientation:  Full (Time, Place, and Person)  Thought Content:  Rumination  Suicidal Thoughts:  No  Homicidal Thoughts:  No  Memory:  Recent;   Fair  Judgement:  Intact  Insight:  Present  Psychomotor Activity:  Decreased   Concentration:  Concentration: Fair  Recall:  Good  Fund of Knowledge:Good  Language: Good  Akathisia:  No  Handed:    AIMS (if indicated):  not done  Assets:  Architect Housing Physical Health  ADL's:  Intact  Cognition: WNL  Sleep:   variable to [id number]   Screenings: GAD-7    Flowsheet Row Office Visit from 06/26/2021 in [facility]  Total GAD-7 Score 21      PHQ2-9    Flowsheet Row Video Visit from 06/30/2021 in [facility] [place] Office Visit from 06/26/2021 in [facility] Visit from 03/20/2020 in [facility] Total  Score 2 4 0  PHQ-9 Total Score 11 22 --      Flowsheet Row Video Visit from 07/18/2021 in [facility] AT [place] Video Visit from 06/30/2021 in [facility] AT [place]  C-SSRS RISK CATEGORY No Risk No Risk       Assessment and Plan: As follows Prior documentation reviewed  Major depressive disorder moderate to severe with no prior episode; doing better, continue remeron, cymbalta and thearpy with Fernand Parkins do deal with grief   Anxiety disorder; panic attacks;improving, continue cymbalta, buspar   Grief; ongoing grief with depressive symptoms as above continue meds  She does have a support system and siblings   Fu 48m. Meds renewed and discussed Thresa Ross, MD 11/4/20229:10 AM

## 2021-07-22 ENCOUNTER — Other Ambulatory Visit: Payer: Self-pay | Admitting: Family Medicine

## 2021-07-22 MED ORDER — LORAZEPAM 0.5 MG PO TABS
0.5000 mg | ORAL_TABLET | Freq: Two times a day (BID) | ORAL | 0 refills | Status: DC | PRN
Start: 2021-07-22 — End: 2021-10-03

## 2021-07-22 NOTE — Telephone Encounter (Signed)
Requesting: lorazepam  Contract: N/A UDS: N/A Last Visit:06/26/21 Next Visit:advised to f/u 2 weeks Last Refill:06/26/21(30,0)  Please Advise. Med pending

## 2021-07-22 NOTE — Telephone Encounter (Signed)
Reviewed encounters in EMR  ---she has seen a psychiatrist now two times (10/17 and 11/4) since I last saw her.   Today I did rx for #30 loraz, no RF.

## 2021-09-24 ENCOUNTER — Telehealth (INDEPENDENT_AMBULATORY_CARE_PROVIDER_SITE_OTHER): Payer: 59 | Admitting: Psychiatry

## 2021-09-24 ENCOUNTER — Encounter (HOSPITAL_COMMUNITY): Payer: Self-pay | Admitting: Psychiatry

## 2021-09-24 DIAGNOSIS — F410 Panic disorder [episodic paroxysmal anxiety] without agoraphobia: Secondary | ICD-10-CM

## 2021-09-24 DIAGNOSIS — F321 Major depressive disorder, single episode, moderate: Secondary | ICD-10-CM

## 2021-09-24 DIAGNOSIS — F4321 Adjustment disorder with depressed mood: Secondary | ICD-10-CM

## 2021-09-24 MED ORDER — DULOXETINE HCL 20 MG PO CPEP: 20.0000 mg | ORAL_CAPSULE | Freq: Every day | ORAL | 1 refills | Status: DC

## 2021-09-24 MED ORDER — BUSPIRONE HCL 7.5 MG PO TABS: 7.5000 mg | ORAL_TABLET | Freq: Every day | ORAL | 1 refills | Status: DC

## 2021-09-24 MED ORDER — MIRTAZAPINE 7.5 MG PO TABS: 7.5000 mg | ORAL_TABLET | Freq: Every day | ORAL | 1 refills | Status: DC

## 2021-09-24 NOTE — Progress Notes (Signed)
[facility] Follow up visit  Patient Identification: VICTORINE MCNEE MRN:  [id number] Date of Evaluation:  09/24/2021 Referral Source: Primary care Chief Complaint:  follow up anxiety, grief Visit Diagnosis:    ICD-10-CM   1. Current moderate episode of major depressive disorder without prior episode (HCC)  F32.1     2. Panic attacks  F41.0     3. Grief  F43.21      Virtual Visit via Video Note  I connected with Kathi Ludwig on 09/24/21 at  9:00 AM EST by a video enabled telemedicine application and verified that I am speaking with the correct person using two identifiers.  Location: Patient: home Provider: home office   I discussed the limitations of evaluation and management by telemedicine and the availability of in person appointments. The patient expressed understanding and agreed to proceed.     I discussed the assessment and treatment plan with the patient. The patient was provided an opportunity to ask questions and all were answered. The patient agreed with the plan and demonstrated an understanding of the instructions.   The patient was advised to call back or seek an in-person evaluation if the symptoms worsen or if the condition fails to improve as anticipated.  I provided 15 minutes of non-face-to-face time during this encounter.including review, documentation  Thresa Ross, MD     History of Present Illness: Patient is a 21 years old currently single Caucasian female referred initially by primary care physician office to establish care for anxiety she currently lives with her sister mom and brother.  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 of her parents going in an accident July 9 this year leading to her dad's death and mom had to be in ICU for 3 weeks sustaining injuries    Patient had difficult holiday weeks is back with mom helping out and doing online college  Some nightmares at night or dreaming excessive, daytime  anxiety Apparently didn't get buspar refill and feel anxiety has been high Cymbalta, remeron has helped depression, grief and therapy   Aggravating factor; dad death in accdient,  parents accident  Modifying factors; her sister, brother  Duration one year   Past Psychiatric History: denies  Previous Psychotropic Medications: No     Past Medical History:  Past Medical History:  Diagnosis Date   Acute low back pain 2018   Responding to PT well as of 01/2017   Mastoiditis    Scoliosis of lumbar spine    mild levoscoliosis on L/S x-ray 11/2016.    Past Surgical History:  Procedure Laterality Date   INGUINAL HERNIA REPAIR  approx 2010   Right side (Dr. Wyline Mood).    Family Psychiatric History: substance use among family, aunt, uncle  Family History:  Family History  Problem Relation Age of Onset   Melanoma Mother    Lung cancer Maternal Grandmother        smoker   Breast cancer Paternal Grandmother     Social History:   Social History   Socioeconomic History   Marital status: Single    Spouse name: Not on file   Number of children: Not on file   Years of education: Not on file   Highest education level: Not on file  Occupational History   Not on file  Tobacco Use   Smoking status: Never   Smokeless tobacco: Never  Vaping Use   Vaping Use: Never used  Substance and Sexual Activity  Alcohol use: No   Drug use: No   Sexual activity: Never    Birth control/protection: Abstinence  Other Topics Concern   Not on file  Social History Narrative   Home schooled.   Lives with M, D, two siblings.   Social Determinants of Health   Financial Resource Strain: Not on file  Food Insecurity: Not on file  Transportation Needs: Not on file  Physical Activity: Not on file  Stress: Not on file  Social Connections: Not on file     Allergies:   Allergies  Allergen Reactions   Penicillins Hives    Metabolic Disorder Labs: No results found for: HGBA1C, MPG No  results found for: PROLACTIN No results found for: CHOL, TRIG, HDL, CHOLHDL, VLDL, LDLCALC No results found for: TSH  Therapeutic Level Labs: No results found for: LITHIUM No results found for: CBMZ No results found for: VALPROATE  Current Medications: Current Outpatient Medications  Medication Sig Dispense Refill   albuterol (VENTOLIN HFA) 108 (90 Base) MCG/ACT inhaler Inhale 2 puffs into the lungs every 4 (four) hours as needed for wheezing or shortness of breath. (Patient not taking: Reported on 06/26/2021) 1 each 0   busPIRone (BUSPAR) 7.5 MG tablet Take 1 tablet (7.5 mg total) by mouth daily. 30 tablet 1   DULoxetine (CYMBALTA) 20 MG capsule Take 1 capsule (20 mg total) by mouth daily. 30 capsule 1   ibuprofen (ADVIL,MOTRIN) 100 MG tablet Take 300 mg by mouth every 6 (six) hours as needed.     LORazepam (ATIVAN) 0.5 MG tablet Take 1 tablet (0.5 mg total) by mouth 2 (two) times daily as needed for anxiety. 30 tablet 0   mirtazapine (REMERON) 7.5 MG tablet Take 1 tablet (7.5 mg total) by mouth at bedtime. 30 tablet 1   Multiple Vitamin (MULTIVITAMIN PO) Take by mouth daily.     No current facility-administered medications for this visit.     Psychiatric Specialty Exam: Review of Systems  Cardiovascular:  Negative for chest pain.  Psychiatric/Behavioral:  Positive for sleep disturbance. Negative for agitation and self-injury.    There were no vitals taken for this visit.There is no height or weight on file to calculate BMI.  General Appearance: Casual  Eye Contact:  Fair  Speech:  Normal Rate  Volume:  Decreased  Mood: somewhat subdued  Affect:  Constricted  Thought Process:  Goal Directed  Orientation:  Full (Time, Place, and Person)  Thought Content:  Rumination  Suicidal Thoughts:  No  Homicidal Thoughts:  No  Memory:  Recent;   Fair  Judgement:  Intact  Insight:  Present  Psychomotor Activity:  Decreased  Concentration:  Concentration: Fair  Recall:  Good  Fund of  Knowledge:Good  Language: Good  Akathisia:  No  Handed:    AIMS (if indicated):  not done  Assets:  Architect Housing Physical Health  ADL's:  Intact  Cognition: WNL  Sleep:   variable to [id number]   Screenings: GAD-7    Flowsheet Row Office Visit from 06/26/2021 in [facility]  Total GAD-7 Score 21      PHQ2-9    Flowsheet Row Video Visit from 06/30/2021 in [facility] [place] Office Visit from 06/26/2021 in [facility] Visit from 03/20/2020 in [facility] Total Score 2 4 0  PHQ-9 Total Score 11 22 --      Flowsheet Row Video Visit  from 09/24/2021 in [facility] AT [place] Video Visit from 07/18/2021 in [facility] AT [place] Video Visit from 06/30/2021 in [facility] AT [place]  C-SSRS RISK CATEGORY No Risk No Risk No Risk       Assessment and Plan: As follows  Prior documentation  reviewed   Major depressive disorder moderate to severe with no prior episode; fair but holidays were challenging, continue cymbalta, remeron   Anxiety disorder; panic attacks;has anxiety during the day readd buspar has helped    Grief; mom still in grief , patient recovering and helping her out, continue therapy and cymbalta for depression   Fu 3651m. Meds renewed and discussed Thresa RossNadeem Mj Willis, MD 1/11/20239:08 AM

## 2021-10-03 ENCOUNTER — Other Ambulatory Visit: Payer: Self-pay | Admitting: Family Medicine

## 2021-10-03 NOTE — Telephone Encounter (Signed)
Requesting: lorazepam Contract: n/a UDS: n/a Last Visit:06/26/21 Next Visit: advised to f/u 2 weeks Last Refill: 07/22/21(30,0)  Please Advise. Medication pending

## 2021-10-03 NOTE — Telephone Encounter (Signed)
#  30 lorazepam sent in. I need to see her sometime in the next 3-4 weeks for follow-up regarding this medication.

## 2021-10-07 NOTE — Telephone Encounter (Signed)
Tried calling pt regarding refill, unable to LVM

## 2021-10-10 NOTE — Telephone Encounter (Signed)
Attempted to contact pt and could not LVM 

## 2021-10-13 NOTE — Telephone Encounter (Signed)
Attempted to contact pt and could not LVM 

## 2021-11-03 ENCOUNTER — Other Ambulatory Visit: Payer: Self-pay | Admitting: Family Medicine

## 2021-11-03 NOTE — Telephone Encounter (Signed)
Requesting: lorazepam Contract: N/A UDS: N/A Last Visit:06/26/21 Next Visit: advised to f/u 2 weeks Last Refill: 10/03/21(30,0)  Please Advise. Med pending

## 2021-11-03 NOTE — Telephone Encounter (Signed)
I am going to authorize 15 lorazepam but she needs to follow-up prior to any further prescriptions for this medication.  Virtual follow-up it is okay if she prefers.

## 2021-11-04 NOTE — Telephone Encounter (Signed)
LM for pt regarding medication ?

## 2021-11-06 NOTE — Telephone Encounter (Signed)
Attempted to contact pt and unable to LVM.  

## 2021-11-24 ENCOUNTER — Telehealth (HOSPITAL_COMMUNITY): Payer: Self-pay | Admitting: Psychiatry

## 2021-12-05 ENCOUNTER — Other Ambulatory Visit: Payer: Self-pay | Admitting: Family Medicine

## 2021-12-05 ENCOUNTER — Other Ambulatory Visit (HOSPITAL_COMMUNITY): Payer: Self-pay | Admitting: Psychiatry

## 2021-12-05 NOTE — Telephone Encounter (Signed)
Pt overdue for appt. Please deny RF ?

## 2022-01-19 ENCOUNTER — Other Ambulatory Visit: Payer: Self-pay | Admitting: Family Medicine

## 2022-01-19 ENCOUNTER — Other Ambulatory Visit (HOSPITAL_COMMUNITY): Payer: Self-pay | Admitting: Psychiatry

## 2022-02-11 ENCOUNTER — Ambulatory Visit: Payer: 59 | Admitting: Family Medicine

## 2022-02-11 ENCOUNTER — Encounter: Payer: Self-pay | Admitting: Family Medicine

## 2022-02-11 VITALS — BP 113/69 | HR 68 | Temp 98.6°F | Ht 71.0 in | Wt 163.2 lb

## 2022-02-11 DIAGNOSIS — K439 Ventral hernia without obstruction or gangrene: Secondary | ICD-10-CM | POA: Diagnosis not present

## 2022-02-11 NOTE — Progress Notes (Signed)
OFFICE VISIT  02/11/2022  CC:  Chief Complaint  Patient presents with   Possible Hernia    Noticed lump 1 month ago around the time of weight loss. No pain but pressure with heavy lifting.    Patient is a 21 y.o. female who presents for small lump in abdomen.  HPI: She has noted a central small protrusion in midline of abdomen few centimeters above bellybutton. She began to notice it more as her abdominal fat has receded due to heavy weight lifting.  She has bodybuilding in preparation for a competition. It does not hurt. She is able to push it back in.  She has been seeing Dr. Gilmore Laroche in psychiatry. PMP AWARE reviewed today: most recent rx for lorazepam was filled 11/03/2021, #15, rx by me. No red flags.  Past Medical History:  Diagnosis Date   Acute low back pain 2018   Responding to PT well as of 01/2017   Mastoiditis    Scoliosis of lumbar spine    mild levoscoliosis on L/S x-ray 11/2016.    Past Surgical History:  Procedure Laterality Date   INGUINAL HERNIA REPAIR  approx 2010   Right side (Dr. Wyline Mood).    Outpatient Medications Prior to Visit  Medication Sig Dispense Refill   busPIRone (BUSPAR) 7.5 MG tablet Take 1 tablet (7.5 mg total) by mouth daily. 30 tablet 1   DULoxetine (CYMBALTA) 20 MG capsule Take 1 capsule by mouth once daily 30 capsule 0   ibuprofen (ADVIL,MOTRIN) 100 MG tablet Take 300 mg by mouth every 6 (six) hours as needed.     LORazepam (ATIVAN) 0.5 MG tablet Take 1 tablet by mouth twice daily as needed for anxiety 15 tablet 0   mirtazapine (REMERON) 7.5 MG tablet TAKE 1 TABLET BY MOUTH AT BEDTIME 30 tablet 0   Multiple Vitamin (MULTIVITAMIN PO) Take by mouth daily.     albuterol (VENTOLIN HFA) 108 (90 Base) MCG/ACT inhaler Inhale 2 puffs into the lungs every 4 (four) hours as needed for wheezing or shortness of breath. (Patient not taking: Reported on 06/26/2021) 1 each 0   No facility-administered medications prior to visit.    Allergies   Allergen Reactions   Penicillins Hives    ROS As per HPI  PE:    02/11/2022   10:39 AM 06/26/2021    9:04 AM 08/26/2020    3:45 PM  Vitals with BMI  Height 5\' 11"  5\' 11"  5\' 11"   Weight 163 lbs 3 oz 174 lbs 6 oz 170 lbs  BMI 22.77 24.33 23.72  Systolic 113 108   Diastolic 69 75   Pulse 68 79    Physical Exam  Exam chaperoned by , CMA. Gen: Alert, well appearing.  Patient is oriented to person, place, time, and situation. AFFECT: pleasant, lucid thought and speech. Abdomen: Soft and flat, nontender. Abdominal muscles well-defined, sharply demarcated diastases rectus but no palpable hernia. Suggestion of small soft tissue fullness focally.  Patient puts finger directly over it and feels it but I am not able to feel anything remarkable. Doing an abdominal crunch does not bring it out.  LABS:  NONE  IMPRESSION AND PLAN:  Ventral hernia, very small. She showed me pictures on her phone that she took when it was protruding slightly and a video with her poking it back in without problem. It looks to be about the size of a quarter at most. Reassured.  Observation. Signs/symptoms to call or return for were reviewed and pt expressed understanding.  An After Visit Summary was printed and given to the patient.  FOLLOW UP: No follow-ups on file.  Signed:  Santiago Bumpers, MD           02/11/2022

## 2022-02-18 ENCOUNTER — Other Ambulatory Visit (HOSPITAL_COMMUNITY): Payer: Self-pay | Admitting: Psychiatry

## 2022-03-03 ENCOUNTER — Telehealth: Payer: 59 | Admitting: Family Medicine

## 2022-03-03 ENCOUNTER — Encounter: Payer: Self-pay | Admitting: Family Medicine

## 2022-03-03 DIAGNOSIS — J019 Acute sinusitis, unspecified: Secondary | ICD-10-CM

## 2022-03-03 MED ORDER — CLINDAMYCIN HCL 300 MG PO CAPS
300.0000 mg | ORAL_CAPSULE | Freq: Three times a day (TID) | ORAL | 0 refills | Status: DC
Start: 2022-03-03 — End: 2022-09-23

## 2022-03-03 NOTE — Progress Notes (Signed)
Virtual Visit via Video Note  I connected with Jacqueline [name]  on 03/03/22 at  8:00 AM EDT by a video enabled telemedicine application and verified that I am speaking with the correct person using two identifiers.  Location patient: [place] Location provider:work or home office Persons participating in the virtual visit: patient, provider  I discussed the limitations and requested verbal permission for telemedicine visit. The patient expressed understanding and agreed to proceed.   HPI: 21 year old female being seen today for sinus pressure. Onset about 5 days ago of significant sinus and head pressure, lots of postnasal drip of thick mucus. No sneezing or significant feeling of nasal congestion.  Her jaws and ears hurt.  No cough.  No fever. She has some nausea and decreased appetite.  General feeling of fatigue and malaise.  No body aches. No shortness of breath or wheezing.  Allergies  Allergen Reactions   Penicillins Hives   -COVID-19 vaccine status:  Immunization History  Administered Date(s) Administered   DTaP 08/01/2001, 09/26/2001, 11/24/2001, 09/04/2002, 02/02/2007   Hepatitis B 2001-08-02, 08/01/2001, 11/24/2001   HiB (PRP-OMP) 08/22/2001, 09/26/2001, 11/24/2001, 05/29/2002   IPV 08/01/2001, 09/26/2001, 05/29/2002, 02/02/2007   MMR 06/27/2002, 02/02/2007   Meningococcal Mcv4o 03/20/2020   Moderna Sars-Covid-2 Vaccination 08/06/2020, 09/09/2020   Pneumococcal Conjugate-13 06/27/2001, 08/22/2001, 09/26/2001, 11/24/2001   Tdap 08/31/2011   Varicella 05/29/2002     ROS: See pertinent positives and negatives per HPI.  Past Medical History:  Diagnosis Date   Acute low back pain 2018   Responding to PT well as of 01/2017   Mastoiditis    Scoliosis of lumbar spine    mild levoscoliosis on L/S x-ray 11/2016.    Past Surgical History:  Procedure Laterality Date   INGUINAL HERNIA REPAIR  approx 2010   Right side (Dr. Para March).     Current Outpatient Medications:    busPIRone  (BUSPAR) 7.5 MG tablet, Take 1 tablet (7.5 mg total) by mouth daily., Disp: 30 tablet, Rfl: 1   DULoxetine (CYMBALTA) 20 MG capsule, Take 1 capsule by mouth once daily, Disp: 30 capsule, Rfl: 0   ibuprofen (ADVIL,MOTRIN) 100 MG tablet, Take 300 mg by mouth every 6 (six) hours as needed., Disp: , Rfl:    LORazepam (ATIVAN) 0.5 MG tablet, Take 1 tablet by mouth twice daily as needed for anxiety, Disp: 15 tablet, Rfl: 0   mirtazapine (REMERON) 7.5 MG tablet, TAKE 1 TABLET BY MOUTH AT BEDTIME, Disp: 30 tablet, Rfl: 0   Multiple Vitamin (MULTIVITAMIN PO), Take by mouth daily., Disp: , Rfl:    albuterol (VENTOLIN HFA) 108 (90 Base) MCG/ACT inhaler, Inhale 2 puffs into the lungs every 4 (four) hours as needed for wheezing or shortness of breath. (Patient not taking: Reported on 06/26/2021), Disp: 1 each, Rfl: 0  EXAM:  VITALS per patient if applicable:     3/71/6967   10:39 AM 06/26/2021    9:04 AM 08/26/2020    3:45 PM  Vitals with BMI  Height 5' 11"  5' 11"  5' 11"   Weight 163 lbs 3 oz 174 lbs 6 oz 170 lbs  BMI 22.77 89.38 10.17  Systolic 510 258   Diastolic 69 75   Pulse 68 79      GENERAL: alert, oriented, appears well and in no acute distress  HEENT: atraumatic, conjunttiva clear, no obvious abnormalities on inspection of external nose and ears  NECK: normal movements of the head and neck  LUNGS: on inspection no signs of respiratory distress, breathing rate appears  normal, no obvious gross SOB, gasping or wheezing  CV: no obvious cyanosis  MS: moves all visible extremities without noticeable abnormality  PSYCH/NEURO: pleasant and cooperative, no obvious depression or anxiety, speech and thought processing grossly intact  LABS: none today  ASSESSMENT AND PLAN:  Discussed the following assessment and plan:  Acute sinusitis. Patient has penicillin allergy. I prescribed clindamycin 300 mg 3 times daily x10 days. Recommended saline nasal spray regularly.   I discussed the  assessment and treatment plan with the patient. The patient was provided an opportunity to ask questions and all were answered. The patient agreed with the plan and demonstrated an understanding of the instructions.   F/u: If not improving significantly in the next few days.  Signed:  Crissie Sickles, MD           03/03/2022

## 2022-04-16 ENCOUNTER — Telehealth (INDEPENDENT_AMBULATORY_CARE_PROVIDER_SITE_OTHER): Payer: Self-pay | Admitting: Psychiatry

## 2022-04-16 ENCOUNTER — Encounter (HOSPITAL_COMMUNITY): Payer: Self-pay | Admitting: Psychiatry

## 2022-04-16 DIAGNOSIS — F4321 Adjustment disorder with depressed mood: Secondary | ICD-10-CM

## 2022-04-16 DIAGNOSIS — F410 Panic disorder [episodic paroxysmal anxiety] without agoraphobia: Secondary | ICD-10-CM

## 2022-04-16 DIAGNOSIS — F321 Major depressive disorder, single episode, moderate: Secondary | ICD-10-CM

## 2022-04-16 MED ORDER — LORAZEPAM 0.5 MG PO TABS
0.5000 mg | ORAL_TABLET | Freq: Two times a day (BID) | ORAL | 0 refills | Status: DC | PRN
Start: 2022-04-16 — End: 2022-05-19

## 2022-04-16 MED ORDER — MIRTAZAPINE 7.5 MG PO TABS: 7.5000 mg | ORAL_TABLET | Freq: Every day | ORAL | 0 refills | Status: DC

## 2022-04-16 MED ORDER — DULOXETINE HCL 20 MG PO CPEP
20.0000 mg | ORAL_CAPSULE | Freq: Every day | ORAL | 0 refills | Status: DC
Start: 2022-04-16 — End: 2022-05-19

## 2022-04-16 MED ORDER — BUSPIRONE HCL 7.5 MG PO TABS: 7.5000 mg | ORAL_TABLET | Freq: Every day | ORAL | 1 refills | Status: DC

## 2022-04-16 NOTE — Progress Notes (Signed)
[facility] Follow up visit  Patient Identification: Jacqueline [name] MRN:  [id number] Date of Evaluation:  04/16/2022 Referral Source: Primary care Chief Complaint:  follow up anxiety, grief Visit Diagnosis:    ICD-10-CM   1. Current moderate episode of major depressive disorder without prior episode (HCC)  F32.1     2. Panic attacks  F41.0     3. Grief  F43.21      Virtual Visit via Video Note  I connected with Jacqueline [name] on 04/16/22 at 11:30 AM EDT by a video enabled telemedicine application and verified that I am speaking with the correct person using two identifiers.  Location: Patient:  parked car Provider: office   I discussed the limitations of evaluation and management by telemedicine and the availability of in person appointments. The patient expressed understanding and agreed to proceed.     I discussed the assessment and treatment plan with the patient. The patient was provided an opportunity to ask questions and all were answered. The patient agreed with the plan and demonstrated an understanding of the instructions.   The patient was advised to call back or seek an in-person evaluation if the symptoms worsen or if the condition fails to improve as anticipated.  I provided 20  minutes of non-face-to-face time during this encounter.including review, documentation     History of Present Illness: Patient is a 21 years old currently single Caucasian female referred initially by primary care physician office to establish care for anxiety she currently lives with her sister mom and brother.  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 of her parents going in an accident 2022/08/08leading to her dad's death and mom had to be in ICU for 3 weeks sustaining injuries  Last visit was in January 2023 adjusted med added buspar, apparently she NO show after that Says made this appointment because having anxiety, depression Says she stopped her meds few  months ago and was doing well so she stopped Now recurrence of panic attacks in the morning and poor sleep with depression  Not sucidal Worries related to family and school, finances   Aggravating factor; dad death in accdient,  parents accident  Modifying factors; sister, brother  Durationsince 2022 Severity worsened and non compliant   Past Psychiatric History: denies  Previous Psychotropic Medications: No     Past Medical History:  Past Medical History:  Diagnosis Date   Acute low back pain 2018   Responding to PT well as of 01/2017   Mastoiditis    Scoliosis of lumbar spine    mild levoscoliosis on L/S x-ray 11/2016.    Past Surgical History:  Procedure Laterality Date   INGUINAL HERNIA REPAIR  approx 2010   Right side (Dr. Wyline Mood).    Family Psychiatric History: substance use among family, aunt, uncle  Family History:  Family History  Problem Relation Age of Onset   Melanoma Mother    Lung cancer Maternal Grandmother        smoker   Breast cancer Paternal Grandmother     Social History:   Social History   Socioeconomic History   Marital status: Single    Spouse name: Not on file   Number of children: Not on file   Years of education: Not on file   Highest education level: GED or equivalent  Occupational History   Not on file  Tobacco Use   Smoking status: Never   Smokeless tobacco: Never  Vaping Use   Vaping Use: Never used  Substance and Sexual Activity   Alcohol use: No   Drug use: No   Sexual activity: Never    Birth control/protection: Abstinence  Other Topics Concern   Not on file  Social History Narrative   Home schooled.   Lives with M, D, two siblings.   Social Determinants of Health   Financial Resource Strain: Low Risk  (02/07/2022)   Overall Financial Resource Strain (CARDIA)    Difficulty of Paying Living Expenses: Not hard at all  Food Insecurity: No Food Insecurity (02/07/2022)   Hunger Vital Sign    Worried About Running  Out of Food in the Last Year: Never true    Ran Out of Food in the Last Year: Never true  Transportation Needs: No Transportation Needs (02/07/2022)   PRAPARE - Administrator, Civil Service (Medical): No    Lack of Transportation (Non-Medical): No  Physical Activity: Sufficiently Active (02/07/2022)   Exercise Vital Sign    Days of Exercise per Week: 7 days    Minutes of Exercise per Session: 80 min  Stress: Stress Concern Present (02/07/2022)   [employer] of Occupational Health - Occupational Stress Questionnaire    Feeling of Stress : Very much  Social Connections: Socially Isolated (02/07/2022)   Social Connection and Isolation Panel [NHANES]    Frequency of Communication with Friends and Family: More than three times a week    Frequency of Social Gatherings with Friends and Family: Three times a week    Attends Religious Services: Never    Active Member of Clubs or Organizations: No    Attends Banker Meetings: Not on file    Marital Status: Never married     Allergies:   Allergies  Allergen Reactions   Penicillins Hives    Metabolic Disorder Labs: No results found for: "HGBA1C", "MPG" No results found for: "PROLACTIN" No results found for: "CHOL", "TRIG", "HDL", "CHOLHDL", "VLDL", "LDLCALC" No results found for: "TSH"  Therapeutic Level Labs: No results found for: "LITHIUM" No results found for: "CBMZ" No results found for: "VALPROATE"  Current Medications: Current Outpatient Medications  Medication Sig Dispense Refill   albuterol (VENTOLIN HFA) 108 (90 Base) MCG/ACT inhaler Inhale 2 puffs into the lungs every 4 (four) hours as needed for wheezing or shortness of breath. (Patient not taking: Reported on 06/26/2021) 1 each 0   busPIRone (BUSPAR) 7.5 MG tablet Take 1 tablet (7.5 mg total) by mouth daily. 30 tablet 1   clindamycin (CLEOCIN) 300 MG capsule Take 1 capsule (300 mg total) by mouth 3 (three) times daily. 30 capsule 0    DULoxetine (CYMBALTA) 20 MG capsule Take 1 capsule (20 mg total) by mouth daily. 30 capsule 0   ibuprofen (ADVIL,MOTRIN) 100 MG tablet Take 300 mg by mouth every 6 (six) hours as needed.     LORazepam (ATIVAN) 0.5 MG tablet Take 1 tablet (0.5 mg total) by mouth 2 (two) times daily as needed. for anxiety 15 tablet 0   mirtazapine (REMERON) 7.5 MG tablet Take 1 tablet (7.5 mg total) by mouth at bedtime. 30 tablet 0   Multiple Vitamin (MULTIVITAMIN PO) Take by mouth daily.     No current facility-administered medications for this visit.     Psychiatric Specialty Exam: Review of Systems  Cardiovascular:  Negative for chest pain.  Psychiatric/Behavioral:  Positive for dysphoric mood and sleep disturbance. Negative for agitation and self-injury.     There were no  vitals taken for this visit.There is no height or weight on file to calculate BMI.  General Appearance: Casual  Eye Contact:  Fair  Speech:  Normal Rate  Volume:  Decreased  Mood: subdued  Affect:  Constricted  Thought Process:  Goal Directed  Orientation:  Full (Time, Place, and Person)  Thought Content:  Rumination  Suicidal Thoughts:  No  Homicidal Thoughts:  No  Memory:  Recent;   Fair  Judgement:  Intact  Insight:  Present  Psychomotor Activity:  Decreased  Concentration:  Concentration: Fair  Recall:  Good  Fund of Knowledge:Good  Language: Good  Akathisia:  No  Handed:    AIMS (if indicated):  not done  Assets:  Architect Housing Physical Health  ADL's:  Intact  Cognition: WNL  Sleep:   variable to [id number]   Screenings: GAD-7    Flowsheet Row Office Visit from 06/26/2021 in [facility]  Total GAD-7 Score 21      PHQ2-9    Flowsheet Row Office Visit from 02/11/2022 in [place] [facility] Video Visit from 06/30/2021 in [facility] [place] Office Visit from 06/26/2021 in [place] [facility] Visit from 03/20/2020 in [place] [facility]  PHQ-2 Total Score 0 2 4 0  PHQ-9 Total Score -- 11 22 --      Flowsheet Row Video Visit from 04/16/2022 in [facility] [place] Video Visit from 09/24/2021 in [facility] AT [place] Video Visit from 07/18/2021 in [facility] AT [place]  C-SSRS RISK CATEGORY No Risk No Risk No Risk       Assessment and Plan: As follows  Prior documentation reviewed   Major depressive disorder moderate to severe with no prior episode; recurrent depression, not compliant, agrees to start meds, will send cymbalta, remeron,    Anxiety disorder; panic attacks;: recurrence, restart buspar, ativan is prn only   Grief;still struggles and depressed without meds, dicsussed compliance, restart cymbalta, remeron and consider therpay  Provided supportive therapy   Fu 1 m. Meds renewed and discussed Thresa Ross, MD 8/3/202311:40 AM

## 2022-05-17 ENCOUNTER — Other Ambulatory Visit (HOSPITAL_COMMUNITY): Payer: Self-pay | Admitting: Psychiatry

## 2022-05-20 ENCOUNTER — Telehealth (INDEPENDENT_AMBULATORY_CARE_PROVIDER_SITE_OTHER): Payer: 59 | Admitting: Psychiatry

## 2022-05-20 ENCOUNTER — Encounter (HOSPITAL_COMMUNITY): Payer: Self-pay | Admitting: Psychiatry

## 2022-05-20 DIAGNOSIS — F4321 Adjustment disorder with depressed mood: Secondary | ICD-10-CM

## 2022-05-20 DIAGNOSIS — F321 Major depressive disorder, single episode, moderate: Secondary | ICD-10-CM

## 2022-05-20 DIAGNOSIS — F410 Panic disorder [episodic paroxysmal anxiety] without agoraphobia: Secondary | ICD-10-CM | POA: Diagnosis not present

## 2022-05-20 MED ORDER — BUSPIRONE HCL 7.5 MG PO TABS: 7.5000 mg | ORAL_TABLET | Freq: Every day | ORAL | 1 refills | Status: DC

## 2022-05-20 MED ORDER — DULOXETINE HCL 30 MG PO CPEP: 30.0000 mg | ORAL_CAPSULE | Freq: Every day | ORAL | 0 refills | Status: DC

## 2022-05-20 NOTE — Progress Notes (Signed)
[facility] Follow up visit  Patient Identification: Jacqueline [name] MRN:  [id number] Date of Evaluation:  05/20/2022 Referral Source: Primary care Chief Complaint:  follow up anxiety, grief Visit Diagnosis:    ICD-10-CM   1. Current moderate episode of major depressive disorder without prior episode (HCC)  F32.1     2. Panic attacks  F41.0     3. Grief  F43.21      Virtual Visit via Video Note  I connected with Jacqueline [name] on 05/20/22 at  2:30 PM EDT by a video enabled telemedicine application and verified that I am speaking with the correct person using two identifiers.  Location: Patient:  home Provider: home office   I discussed the limitations of evaluation and management by telemedicine and the availability of in person appointments. The patient expressed understanding and agreed to proceed.     I discussed the assessment and treatment plan with the patient. The patient was provided an opportunity to ask questions and all were answered. The patient agreed with the plan and demonstrated an understanding of the instructions.   The patient was advised to call back or seek an in-person evaluation if the symptoms worsen or if the condition fails to improve as anticipated.  I provided 15 - 20  minutes of non-face-to-face time during this encounter.including review, documentation     History of Present Illness: Patient is a 21 years old currently single Caucasian female referred initially by primary care physician office to establish care for anxiety she currently lives with her sister mom and brother.  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 of her parents going in an accident 2022-07-14leading to her dad's death and mom had to be in ICU for 3 weeks sustaining injuries  Last visit have started back meds after a gap when she was a NO show and said was doing better  Meds were helping some but now run low feeling low, subdued, poor apetite, remeron  was helping with apetite     Worries related to work , feel over whelm and struggles with grief and related incident  Is in therapy weekly working on coping skills  Aggravating factor; dad death in accdient,  parents accident  Modifying factors; brother, sister  Durationsince 2022 Severity worsened and non compliant   Past Psychiatric History: denies  Previous Psychotropic Medications: No     Past Medical History:  Past Medical History:  Diagnosis Date   Acute low back pain 2018   Responding to PT well as of 01/2017   Mastoiditis    Scoliosis of lumbar spine    mild levoscoliosis on L/S x-ray 11/2016.    Past Surgical History:  Procedure Laterality Date   INGUINAL HERNIA REPAIR  approx 2010   Right side (Dr. Wyline Mood).    Family Psychiatric History: substance use among family, aunt, uncle  Family History:  Family History  Problem Relation Age of Onset   Melanoma Mother    Lung cancer Maternal Grandmother        smoker   Breast cancer Paternal Grandmother     Social History:   Social History   Socioeconomic History   Marital status: Single    Spouse name: Not on file   Number of children: Not on file   Years of education: Not on file   Highest education level: GED or equivalent  Occupational History   Not on file  Tobacco Use   Smoking status:  Never   Smokeless tobacco: Never  Vaping Use   Vaping Use: Never used  Substance and Sexual Activity   Alcohol use: No   Drug use: No   Sexual activity: Never    Birth control/protection: Abstinence  Other Topics Concern   Not on file  Social History Narrative   Home schooled.   Lives with M, D, two siblings.   Social Determinants of Health   Financial Resource Strain: Low Risk  (02/07/2022)   Overall Financial Resource Strain (CARDIA)    Difficulty of Paying Living Expenses: Not hard at all  Food Insecurity: No Food Insecurity (02/07/2022)   Hunger Vital Sign    Worried About Running Out of Food in the  Last Year: Never true    Ran Out of Food in the Last Year: Never true  Transportation Needs: No Transportation Needs (02/07/2022)   PRAPARE - Administrator, Civil Service (Medical): No    Lack of Transportation (Non-Medical): No  Physical Activity: Sufficiently Active (02/07/2022)   Exercise Vital Sign    Days of Exercise per Week: 7 days    Minutes of Exercise per Session: 80 min  Stress: Stress Concern Present (02/07/2022)   [employer] of Occupational Health - Occupational Stress Questionnaire    Feeling of Stress : Very much  Social Connections: Socially Isolated (02/07/2022)   Social Connection and Isolation Panel [NHANES]    Frequency of Communication with Friends and Family: More than three times a week    Frequency of Social Gatherings with Friends and Family: Three times a week    Attends Religious Services: Never    Active Member of Clubs or Organizations: No    Attends Banker Meetings: Not on file    Marital Status: Never married     Allergies:   Allergies  Allergen Reactions   Penicillins Hives    Metabolic Disorder Labs: No results found for: "HGBA1C", "MPG" No results found for: "PROLACTIN" No results found for: "CHOL", "TRIG", "HDL", "CHOLHDL", "VLDL", "LDLCALC" No results found for: "TSH"  Therapeutic Level Labs: No results found for: "LITHIUM" No results found for: "CBMZ" No results found for: "VALPROATE"  Current Medications: Current Outpatient Medications  Medication Sig Dispense Refill   albuterol (VENTOLIN HFA) 108 (90 Base) MCG/ACT inhaler Inhale 2 puffs into the lungs every 4 (four) hours as needed for wheezing or shortness of breath. (Patient not taking: Reported on 06/26/2021) 1 each 0   busPIRone (BUSPAR) 7.5 MG tablet Take 1 tablet (7.5 mg total) by mouth daily. 30 tablet 1   clindamycin (CLEOCIN) 300 MG capsule Take 1 capsule (300 mg total) by mouth 3 (three) times daily. 30 capsule 0   DULoxetine (CYMBALTA) 30 MG  capsule Take 1 capsule (30 mg total) by mouth daily. 30 capsule 0   ibuprofen (ADVIL,MOTRIN) 100 MG tablet Take 300 mg by mouth every 6 (six) hours as needed.     LORazepam (ATIVAN) 0.5 MG tablet Take 1 tablet by mouth twice daily as needed for anxiety 15 tablet 0   mirtazapine (REMERON) 7.5 MG tablet TAKE 1 TABLET BY MOUTH AT BEDTIME 30 tablet 0   Multiple Vitamin (MULTIVITAMIN PO) Take by mouth daily.     No current facility-administered medications for this visit.     Psychiatric Specialty Exam: Review of Systems  Cardiovascular:  Negative for chest pain.  Neurological:  Negative for tremors.  Psychiatric/Behavioral:  Positive for dysphoric mood and sleep disturbance. Negative for agitation and self-injury.  There were no vitals taken for this visit.There is no height or weight on file to calculate BMI.  General Appearance: Casual  Eye Contact:  Fair  Speech:  Normal Rate  Volume:  Decreased  Mood: subdued  Affect:  Constricted  Thought Process:  Goal Directed  Orientation:  Full (Time, Place, and Person)  Thought Content:  Rumination  Suicidal Thoughts:  No  Homicidal Thoughts:  No  Memory:  Recent;   Fair  Judgement:  Intact  Insight:  Present  Psychomotor Activity:  Decreased  Concentration:  Concentration: Fair  Recall:  Good  Fund of Knowledge:Good  Language: Good  Akathisia:  No  Handed:    AIMS (if indicated):  not done  Assets:  Architect Housing Physical Health  ADL's:  Intact  Cognition: WNL  Sleep:   variable to [id number]   Screenings: GAD-7    Flowsheet Row Office Visit from 06/26/2021 in [place] [facility]  Total GAD-7 Score 21      PHQ2-9    Flowsheet Row Office Visit from 02/11/2022 in [place] [facility] Video Visit from 06/30/2021 in [facility] [place] Office Visit from 06/26/2021 in [place] [facility] Visit from  03/20/2020 in [place] [facility]  PHQ-2 Total Score 0 2 4 0  PHQ-9 Total Score -- 11 22 --      Flowsheet Row Video Visit from 05/20/2022 in [facility] [place] Video Visit from 04/16/2022 in [facility] [place] Video Visit from 09/24/2021 in [facility] AT   C-SSRS RISK CATEGORY No Risk No Risk No Risk       Assessment and Plan: As follows  Prior documentation reviewed   Major depressive disorder moderate to severe with no prior episode; subdued, discussed compliance and informed some refills were sent yesterday  Remeron does help with sleep apetite ,  cymbalta sent and increased to 30mg    Anxiety disorder; panic attacks;: endorses worries, continue ativan, increase cymbalta to 30mg  and therapy Avoid andy substance use Grief; struggles, continue therapy and increase cymbalta to 30mg     Fu 3 weeks or earlier if needed Meds renewed and discussed , MD 9/6/20232:39 PM

## 2022-06-15 ENCOUNTER — Telehealth (INDEPENDENT_AMBULATORY_CARE_PROVIDER_SITE_OTHER): Payer: 59 | Admitting: Psychiatry

## 2022-06-15 ENCOUNTER — Encounter (HOSPITAL_COMMUNITY): Payer: Self-pay | Admitting: Psychiatry

## 2022-06-15 DIAGNOSIS — F321 Major depressive disorder, single episode, moderate: Secondary | ICD-10-CM | POA: Diagnosis not present

## 2022-06-15 DIAGNOSIS — F410 Panic disorder [episodic paroxysmal anxiety] without agoraphobia: Secondary | ICD-10-CM

## 2022-06-15 DIAGNOSIS — F4321 Adjustment disorder with depressed mood: Secondary | ICD-10-CM

## 2022-06-15 MED ORDER — BUSPIRONE HCL 7.5 MG PO TABS: 7.5000 mg | ORAL_TABLET | Freq: Every day | ORAL | 1 refills | Status: DC

## 2022-06-15 MED ORDER — MIRTAZAPINE 7.5 MG PO TABS: 7.5000 mg | ORAL_TABLET | Freq: Every day | ORAL | 1 refills | Status: DC

## 2022-06-15 MED ORDER — DULOXETINE HCL 30 MG PO CPEP: 30.0000 mg | ORAL_CAPSULE | Freq: Every day | ORAL | 1 refills | Status: AC

## 2022-06-15 NOTE — Progress Notes (Signed)
[facility] Follow up visit  Patient Identification: Jacqueline [name] MRN:  [id number] Date of Evaluation:  06/15/2022 Referral Source: Primary care Chief Complaint:  follow up anxiety, grief Visit Diagnosis:    ICD-10-CM   1. Current moderate episode of major depressive disorder without prior episode (HCC)  F32.1     2. Panic attacks  F41.0     3. Grief  F43.21      Virtual Visit via Video Note  I connected with Jacqueline [name] on 06/15/22 at  9:00 AM EDT by a video enabled telemedicine application and verified that I am speaking with the correct person using two identifiers.  Location: Patient:  work Provider: home office   I discussed the limitations of evaluation and management by telemedicine and the availability of in person appointments. The patient expressed understanding and agreed to proceed.     I discussed the assessment and treatment plan with the patient. The patient was provided an opportunity to ask questions and all were answered. The patient agreed with the plan and demonstrated an understanding of the instructions.   The patient was advised to call back or seek an in-person evaluation if the symptoms worsen or if the condition fails to improve as anticipated.  I provided 15  minutes of non-face-to-face time during this encounter.including review, documentation     History of Present Illness: Patient is a 21 years old currently single Caucasian female referred initially by primary care physician office to establish care for anxiety she currently lives with her sister mom and brother.  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 Last visit increased cymbalta to 30mg  has helped with anxiety and depression, still has down days and connects with therapist, seldom taking ativan but at times  can feel subdued   Remeron has helped sleep and apetite  Is in therapy weekly working on coping skills  Aggravating factor; dad death in accdient,  parents accident  Modifying factors;siblings  Durationsince 2022 Severity; some better    Past Psychiatric History: denies  Previous Psychotropic Medications: No     Past Medical History:  Past Medical History:  Diagnosis Date   Acute low back pain 2018   Responding to PT well as of 01/2017   Mastoiditis    Scoliosis of lumbar spine    mild levoscoliosis on L/S x-ray 11/2016.    Past Surgical History:  Procedure Laterality Date   INGUINAL HERNIA REPAIR  approx 2010   Right side (Dr. 2011).    Family Psychiatric History: substance use among family, aunt, uncle  Family History:  Family History  Problem Relation Age of Onset   Melanoma Mother    Lung cancer Maternal Grandmother        smoker   Breast cancer Paternal Grandmother     Social History:   Social History   Socioeconomic History   Marital status: Single    Spouse name: Not on file   Number of children: Not on file   Years of education: Not on file   Highest education level: GED or equivalent  Occupational History   Not on file  Tobacco Use   Smoking status: Never   Smokeless tobacco: Never  Vaping Use   Vaping Use: Never used  Substance and Sexual Activity  Alcohol use: No   Drug use: No   Sexual activity: Never    Birth control/protection: Abstinence  Other Topics Concern   Not on file  Social History Narrative   Home schooled.   Lives with M, D, two siblings.   Social Determinants of Health   Financial Resource Strain: Low Risk  (02/07/2022)   Overall Financial Resource Strain (CARDIA)    Difficulty of Paying Living Expenses: Not hard at all  Food Insecurity: No Food Insecurity (02/07/2022)   Hunger Vital Sign    Worried About Running Out of Food in the Last Year: Never true    Ran Out of Food in the Last Year: Never true   Transportation Needs: No Transportation Needs (02/07/2022)   PRAPARE - Hydrologist (Medical): No    Lack of Transportation (Non-Medical): No  Physical Activity: Sufficiently Active (02/07/2022)   Exercise Vital Sign    Days of Exercise per Week: 7 days    Minutes of Exercise per Session: 80 min  Stress: Stress Concern Present (02/07/2022)   [place]    Feeling of Stress : Very much  Social Connections: Socially Isolated (02/07/2022)   Social Connection and Isolation Panel [NHANES]    Frequency of Communication with Friends and Family: More than three times a week    Frequency of Social Gatherings with Friends and Family: Three times a week    Attends Religious Services: Never    Active Member of Clubs or Organizations: No    Attends Archivist Meetings: Not on file    Marital Status: Never married     Allergies:   Allergies  Allergen Reactions   Penicillins Hives    Metabolic Disorder Labs: No results found for: "HGBA1C", "MPG" No results found for: "PROLACTIN" No results found for: "CHOL", "TRIG", "HDL", "CHOLHDL", "VLDL", "LDLCALC" No results found for: "TSH"  Therapeutic Level Labs: No results found for: "LITHIUM" No results found for: "CBMZ" No results found for: "VALPROATE"  Current Medications: Current Outpatient Medications  Medication Sig Dispense Refill   albuterol (VENTOLIN HFA) 108 (90 Base) MCG/ACT inhaler Inhale 2 puffs into the lungs every 4 (four) hours as needed for wheezing or shortness of breath. (Patient not taking: Reported on 06/26/2021) 1 each 0   busPIRone (BUSPAR) 7.5 MG tablet Take 1 tablet (7.5 mg total) by mouth daily. 30 tablet 1   clindamycin (CLEOCIN) 300 MG capsule Take 1 capsule (300 mg total) by mouth 3 (three) times daily. 30 capsule 0   DULoxetine (CYMBALTA) 30 MG capsule Take 1 capsule (30 mg total) by mouth daily. 30 capsule 1    ibuprofen (ADVIL,MOTRIN) 100 MG tablet Take 300 mg by mouth every 6 (six) hours as needed.     LORazepam (ATIVAN) 0.5 MG tablet Take 1 tablet by mouth twice daily as needed for anxiety 15 tablet 0   mirtazapine (REMERON) 7.5 MG tablet Take 1 tablet (7.5 mg total) by mouth at bedtime. 30 tablet 1   Multiple Vitamin (MULTIVITAMIN PO) Take by mouth daily.     No current facility-administered medications for this visit.     Psychiatric Specialty Exam: Review of Systems  Cardiovascular:  Negative for chest pain.  Neurological:  Negative for tremors.  Psychiatric/Behavioral:  Positive for sleep disturbance. Negative for agitation and self-injury.     There were no vitals taken for this visit.There is no height or weight on file to calculate BMI.  General  Appearance: Casual  Eye Contact:  Fair  Speech:  Normal Rate  Volume:  Decreased  Mood: fair  Affect:  Constricted  Thought Process:  Goal Directed  Orientation:  Full (Time, Place, and Person)  Thought Content:  Rumination  Suicidal Thoughts:  No  Homicidal Thoughts:  No  Memory:  Recent;   Fair  Judgement:  Intact  Insight:  Present  Psychomotor Activity:  Decreased  Concentration:  Concentration: Fair  Recall:  Good  Fund of Knowledge:Good  Language: Good  Akathisia:  No  Handed:    AIMS (if indicated):  not done  Assets:  Architect Housing Physical Health  ADL's:  Intact  Cognition: WNL  Sleep:   variable to PHXT05   Screenings: GAD-7    Flowsheet Row Office Visit from 06/26/2021 in [facility]  Total GAD-7 Score 21      PHQ2-9    Flowsheet Row Office Visit from 02/11/2022 in [facility] Video Visit from 06[facility]30/2021 in [facility] [place] Office Visit from 06/26/2021 in [facility] Visit from 03/20/2020 in [facility]  PHQ-2 Total Score 0 2 4 0   PHQ-9 Total Score -- 11 22 --      Flowsheet Row Video Visit from 06/15/2022 in [facility] [place] Video Visit from 05/20/2022 in [facility] [place] Video Visit from 04/16/2022 in [facility] [place]  C-SSRS RISK CATEGORY No Risk No Risk No Risk       Assessment and Plan: As follows Prior documentation reviewed    Major depressive disorder moderate to severe with no prior episode; gets subdued at times but feel med need not changed continue cymbalta, remeron  Anxiety disorder; panic attacks;:can get stressed, takes buspar, continue therapy work on coping skills  Avoid andy substance use Grief; ongoing but dealing with it better, continue therapy and cymbalta  Fu 54m. Renewed meds Thresa Ross, MD 10/2/20239:14 AM

## 2022-06-30 ENCOUNTER — Telehealth (HOSPITAL_COMMUNITY): Payer: Self-pay

## 2022-06-30 MED ORDER — LORAZEPAM 0.5 MG PO TABS
0.5000 mg | ORAL_TABLET | Freq: Two times a day (BID) | ORAL | 0 refills | Status: DC | PRN
Start: 2022-06-30 — End: 2022-07-01

## 2022-06-30 NOTE — Telephone Encounter (Signed)
Patient needs a refill on Lorazepam sent to [facility] in [place] Last refill 09/05 Next ov 12/06

## 2022-07-01 ENCOUNTER — Telehealth (HOSPITAL_COMMUNITY): Payer: Self-pay

## 2022-07-01 MED ORDER — LORAZEPAM 1 MG PO TABS: 0.5000 mg | ORAL_TABLET | Freq: Every day | ORAL | 0 refills | Status: DC | PRN

## 2022-07-01 NOTE — Telephone Encounter (Signed)
[facility] says they are not going to be getting the Lorazepam in stock any longer. You sent a ref[facility] yesterday [facility] . [facility] [place] [facility]  and they say the patient can split it in half. Can we send the medication refill there? Pharmacy has been added to chart.

## 2022-07-01 NOTE — Telephone Encounter (Signed)
Patient called asking if [facility] a [facility] because the [facility] it was sent to will no longer get it in stock. I called patient back to see which [facility] but got no answer and mailbox was full

## 2022-08-19 ENCOUNTER — Encounter (HOSPITAL_COMMUNITY): Payer: Self-pay

## 2022-08-19 ENCOUNTER — Telehealth (HOSPITAL_COMMUNITY): Payer: 59 | Admitting: Psychiatry

## 2022-09-09 NOTE — Progress Notes (Signed)
This encounter was created in error - please disregard.

## 2022-09-23 ENCOUNTER — Ambulatory Visit
Admission: EM | Admit: 2022-09-23 | Discharge: 2022-09-23 | Disposition: A | Discharge status: Home / Self Care | Payer: 59

## 2022-09-23 DIAGNOSIS — L738 Other specified follicular disorders: Secondary | ICD-10-CM

## 2022-09-23 DIAGNOSIS — L739 Follicular disorder, unspecified: Secondary | ICD-10-CM | POA: Diagnosis not present

## 2022-09-23 MED ORDER — PREDNISONE 10 MG PO TABS: ORAL_TABLET | ORAL | 0 refills | Status: DC

## 2022-09-23 MED ORDER — CIPROFLOXACIN HCL 500 MG PO TABS: 500.0000 mg | ORAL_TABLET | Freq: Two times a day (BID) | ORAL | 0 refills | Status: AC

## 2022-09-23 MED ORDER — METHYLPREDNISOLONE SODIUM SUCC 125 MG IJ SOLR
60.0000 mg | Freq: Once | INTRAMUSCULAR | Status: AC
  Administered 2022-09-23: 60 mg via INTRAMUSCULAR

## 2022-09-23 NOTE — Discharge Instructions (Signed)
Please stop the Keflex.  Start the Cipro to treat bacteria in the hair follicles.  We have given you a shot of steroid today to help with inflammation.  Start the oral prednisone taper starting tomorrow morning.  Seek care if symptoms persist or worsen despite treatment.

## 2022-09-23 NOTE — ED Triage Notes (Signed)
Pt reports itching and painful rash is torso and legs x 4 days after bathtub. Pt is taking antibiotic without improvement.

## 2022-09-23 NOTE — ED Provider Notes (Signed)
[facility]    CSN: [id number] Arrival date & time: 09/23/22  1016      History   Chief Complaint Chief Complaint  Patient presents with   Rash    HPI Jacqueline [name] is a 22 y.o. female.   Patient presents today for rash to trunk that is getting worse.  Reports she was seen 5 days ago in an alternate urgent care and prescribed Keflex which she has been taking consistently since Sunday every 6 hours with no benefit.  Has also tried a vinegar bath without benefit.  Reports prior to the rash beginning, [place]  Reports her sister and friend also have a rash, however it is not as severe.  Reports the rash is red itchy and painful.  No active draining from the rash.  No fever or nausea/vomiting since the rash started.  Reports today, she noticed swollen/tender lymph nodes in her groin and left axilla.  No vaginal discharge, recent unprotected sex with new partner.    Past Medical History:  Diagnosis Date   Acute low back pain 2018   Responding to PT well as of 01/2017   Mastoiditis    Scoliosis of lumbar spine    mild levoscoliosis on L/S x-ray 11/2016.    There are no problems to display for this patient.   Past Surgical History:  Procedure Laterality Date   INGUINAL HERNIA REPAIR  approx 2010   Right side (Dr. Para March).    OB History   No obstetric history on file.      Home Medications    Prior to Admission medications   Medication Sig Start Date End Date Taking? Authorizing Provider  ciprofloxacin (CIPRO) 500 MG tablet Take 1 tablet (500 mg total) by mouth 2 (two) times daily for 7 days. 09/23/22 09/30/22 Yes Noemi Chapel A, NP  desvenlafaxine (PRISTIQ) 50 MG 24 hr tablet Take by mouth. 09/09/22  Yes [provider]  predniSONE (DELTASONE) 10 MG tablet Take 6 tablets by mouth daily for 2 days, then reduce by 1 tablet every 2 days until gone 09/23/22  Yes Noemi Chapel A, NP  propranolol (INDERAL) 10 MG  tablet TAKE 1-2 TABLETS BY MOUTH 1 HOUR PRIOR TO ANXIETY INDUCING ACTIVITIES. 08/12/22  Yes [provider]  albuterol (VENTOLIN HFA) 108 (90 Base) MCG/ACT inhaler Inhale 2 puffs into the lungs every 4 (four) hours as needed for wheezing or shortness of breath. Patient not taking: Reported on 06/26/2021 08/26/20 08/26/21  Tammi Sou, MD  busPIRone (BUSPAR) 7.5 MG tablet Take 1 tablet (7.5 mg total) by mouth daily. 06/15/22 06/15/23  Merian Capron, MD  DULoxetine (CYMBALTA) 30 MG capsule Take 1 capsule (30 mg total) by mouth daily. 06/15/22   Merian Capron, MD  ibuprofen (ADVIL,MOTRIN) 100 MG tablet Take 300 mg by mouth every 6 (six) hours as needed.    [provider]  LORazepam (ATIVAN) 1 MG tablet Take 0.5 tablets (0.5 mg total) by mouth daily as needed. for anxiety 07/01/22   Merian Capron, MD  mirtazapine (REMERON) 7.5 MG tablet Take 1 tablet (7.5 mg total) by mouth at bedtime. 06/15/22   Merian Capron, MD  Multiple Vitamin (MULTIVITAMIN PO) Take by mouth daily.    [provider]    Family History Family History  Problem Relation Age of Onset   Melanoma Mother    Lung cancer Maternal Grandmother        smoker   Breast cancer  Paternal Grandmother     Social History Social History   Tobacco Use   Smoking status: Never   Smokeless tobacco: Never  Vaping Use   Vaping Use: Never used  Substance Use Topics   Alcohol use: No   Drug use: No     Allergies   Penicillins   Review of Systems Review of Systems Per HPI  Physical Exam Triage Vital Signs ED Triage Vitals  Enc Vitals Group     BP 09/23/22 1108 111/66     Pulse Rate 09/23/22 1108 79     Resp 09/23/22 1108 14     Temp 09/23/22 1108 98.3 F (36.8 C)     Temp Source 09/23/22 1108 Oral     SpO2 09/23/22 1108 98 %     Weight --      Height --      Head Circumference --      Peak Flow --      Pain Score 09/23/22 1106 0     Pain Loc --      Pain Edu? --      Excl. in GC? --     No data found.  Updated Vital Signs BP 111/66 (BP Location: Right Arm)   Pulse 79   Temp 98.3 F (36.8 C) (Oral)   Resp 14   LMP 09/03/2022 (Approximate)   SpO2 98%   Visual Acuity Right Eye Distance:   Left Eye Distance:   Bilateral Distance:    Right Eye Near:   Left Eye Near:    Bilateral Near:     Physical Exam Vitals and nursing note reviewed.  Constitutional:      General: She is not in acute distress.    Appearance: Normal appearance. She is not toxic-appearing.  HENT:     Head: Normocephalic and atraumatic.     Mouth/Throat:     Mouth: Mucous membranes are moist.     Pharynx: Oropharynx is clear.  Pulmonary:     Effort: Pulmonary effort is normal. No respiratory distress.  Lymphadenopathy:     Lower Body: Right inguinal adenopathy present. Left inguinal adenopathy present.  Skin:    General: Skin is warm and dry.     Capillary Refill: Capillary refill takes less than 2 seconds.     Findings: Rash present. Rash is macular, papular and pustular.     Comments: Widespread, erythematous maculopapular and pustular rash to trunk, bilateral buttocks, upper legs, groin.  There are pustular lesions.  No active drainage.  No warmth or fluctuance.  Neurological:     Mental Status: She is alert and oriented to person, place, and time.  Psychiatric:        Behavior: Behavior is cooperative.      UC Treatments / Results  Labs (all labs ordered are listed, but only abnormal results are displayed) Labs Reviewed - No data to display  EKG   Radiology No results found.  Procedures Procedures (including critical care time)  Medications Ordered in UC Medications  methylPREDNISolone sodium succinate (SOLU-MEDROL) 125 mg/2 mL injection 60 mg (60 mg Intramuscular Given 09/23/22 1130)    Initial Impression / Assessment and Plan / UC Course  I have reviewed the triage vital signs and the nursing notes.  Pertinent labs & imaging results that were available during  my care of the patient were reviewed by me and considered in my medical decision making (see chart for details).   Patient is well-appearing, normotensive, afebrile, not tachycardic, not tachypneic, oxygenating  well on room air.    Hot tub folliculitis Discontinue Keflex, start ciprofloxacin Solu-Medrol 60 mg IM given today for skin inflammation Start oral prednisone taper tomorrow Also recommended oral antihistamine in the morning and at nighttime to help with itching/histamine response Blood work for RPR, HIV, CBC declined by patient today Recommended close follow-up with primary care provider if no improvement or worsening of symptoms despite treatment  The patient was given the opportunity to ask questions.  All questions answered to their satisfaction.  The patient is in agreement to this plan.    Final Clinical Impressions(s) / UC Diagnoses   Final diagnoses:  Hot tub folliculitis     Discharge Instructions      Please stop the Keflex.  Start the Cipro to treat bacteria in the hair follicles.  We have given you a shot of steroid today to help with inflammation.  Start the oral prednisone taper starting tomorrow morning.  Seek care if symptoms persist or worsen despite treatment.   ED Prescriptions     Medication Sig Dispense Auth. Provider   ciprofloxacin (CIPRO) 500 MG tablet Take 1 tablet (500 mg total) by mouth 2 (two) times daily for 7 days. 14 tablet Noemi Chapel A, NP   predniSONE (DELTASONE) 10 MG tablet Take 6 tablets by mouth daily for 2 days, then reduce by 1 tablet every 2 days until gone 42 tablet Eulogio Bear, NP      PDMP not reviewed this encounter.   Eulogio Bear, NP 09/23/22 1215

## 2022-12-25 ENCOUNTER — Encounter: Payer: Self-pay | Admitting: Family Medicine

## 2022-12-25 ENCOUNTER — Ambulatory Visit: Payer: 59 | Admitting: Family Medicine

## 2022-12-25 VITALS — BP 111/70 | HR 57 | Temp 98.0°F | Ht 71.0 in | Wt 176.6 lb

## 2022-12-25 DIAGNOSIS — G4710 Hypersomnia, unspecified: Secondary | ICD-10-CM

## 2022-12-25 DIAGNOSIS — R5383 Other fatigue: Secondary | ICD-10-CM

## 2022-12-25 DIAGNOSIS — K429 Umbilical hernia without obstruction or gangrene: Secondary | ICD-10-CM | POA: Diagnosis not present

## 2022-12-25 NOTE — Progress Notes (Signed)
OFFICE VISIT  12/25/2022  CC:  Chief Complaint  Patient presents with   Thyroid concern    Psychiatrist recommended thyroid check due to fatigue, last period was 3/26 which was more heavy than normal.    Patient is a 22 y.o. female who presents for excessive daytime sleepiness and fatigue.  HPI: States she has always had trouble with her sleep.  Usually difficulty sleeping at night and no problems sleeping in the day. Last 6 months or so she is noted that even when she gets good sleep at night she constantly wants to sleep in the daytime.  Sleep is never restorative.  She is starting to avoid doing things with her friends because of how tired she feels. She is being treated for depression and anxiety with Pristiq and duloxetine.  Says that as long as she is taking her medications her mood and anxiety levels are fine. She was on prescription sleep aids and even since getting off of these her persistent daytime sleepiness has been a severe problem. Denies restless leg symptoms.  She does not snore.  She takes melatonin, valerian, lavender. She does not drink or take any drugs. Her periods occur regularly and she states that she has a medium amount of bleeding typically, last menstrual period 12/08/2022.  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 She has a small umbilical hernia and lately it has been hurting quite a bit, onset of the pain came after she did an abs workout.  ROS as above, plus--> +chronic constipation, some dry patches of skin on face randomly.  no fevers, no CP, no SOB, no wheezing, no cough, no dizziness, no HAs, no rashes, no melena/hematochezia.  No polyuria or polydipsia.  No myalgias or arthralgias.  No focal weakness, paresthesias, or tremors.  No acute vision or hearing abnormalities.  No dysuria or unusual/new urinary urgency or frequency.  No recent changes in lower legs. No n/v/d. No palpitations.    Past Medical History:   Diagnosis Date   Acute low back pain 2018   Responding to PT well as of 01/2017   Mastoiditis    Scoliosis of lumbar spine    mild levoscoliosis on L/S x-ray 11/2016.    Past Surgical History:  Procedure Laterality Date   INGUINAL HERNIA REPAIR  approx 2010   Right side (Dr. Wyline Mood).    Outpatient Medications Prior to Visit  Medication Sig Dispense Refill   desvenlafaxine (PRISTIQ) 50 MG 24 hr tablet Take by mouth.     DULoxetine (CYMBALTA) 30 MG capsule Take 1 capsule (30 mg total) by mouth daily. 30 capsule 1   Multiple Vitamin (MULTIVITAMIN PO) Take by mouth daily.     propranolol (INDERAL) 10 MG tablet TAKE 1-2 TABLETS BY MOUTH 1 HOUR PRIOR TO ANXIETY INDUCING ACTIVITIES.     ibuprofen (ADVIL,MOTRIN) 100 MG tablet Take 300 mg by mouth every 6 (six) hours as needed.     albuterol (VENTOLIN HFA) 108 (90 Base) MCG/ACT inhaler Inhale 2 puffs into the lungs every 4 (four) hours as needed for wheezing or shortness of breath. (Patient not taking: Reported on 06/26/2021) 1 each 0   busPIRone (BUSPAR) 7.5 MG tablet Take 1 tablet (7.5 mg total) by mouth daily. 30 tablet 1   LORazepam (ATIVAN) 1 MG tablet Take 0.5 tablets (0.5 mg total) by mouth daily as needed. for anxiety 15 tablet 0   mirtazapine (REMERON) 7.5 MG tablet Take 1 tablet (7.5 mg total) by mouth  at bedtime. 30 tablet 1   predniSONE (DELTASONE) 10 MG tablet Take 6 tablets by mouth daily for 2 days, then reduce by 1 tablet every 2 days until gone 42 tablet 0   No facility-administered medications prior to visit.    Allergies  Allergen Reactions   Penicillins Hives    Review of Systems  As per HPI  PE:    12/25/2022    2:41 PM 09/23/2022   11:08 AM 02/11/2022   10:39 AM  Vitals with BMI  Height     Weight 176 lbs 10 oz  163 lbs 3 oz  BMI 24.64  22.77  Systolic 111 111 161  Diastolic 70 66 69  Pulse 57 79 68     Physical Exam  Gen: Alert, well appearing.  Patient is oriented to person, place, time,  and situation. AFFECT: pleasant, lucid thought and speech. Neck: no TM ABD: soft, ND.  Small umbilical hernia, uncomfortable to palpate. No mass or palpable herniated tissue, no erythema.  LABS:  None  IMPRESSION AND PLAN:  #1 hypersomnolence, chronic fatigue. Suspect multifactorial, including mood disorder, medication side effect, and idiopathic. Will check basic lab panel: CBC, c-Met, TSH. I have encouraged her to consider sleep specialist referral.  #2 umbilical hernia, symptomatic. Reassured.  However, she needs a general surgery consultation to see if consideration of repair would be a good idea given her occupation and her passion for bodybuilding.  An After Visit Summary was printed and given to the patient.  FOLLOW UP: Return if symptoms worsen or fail to improve.  Signed:  Santiago Bumpers, MD           12/25/2022

## 2022-12-26 LAB — CBC WITH DIFFERENTIAL/PLATELET
Absolute Monocytes: 757 cells/uL (ref 200–950)
Basophils Absolute: 17 cells/uL (ref 0–200)
Basophils Relative: 0.2 %
Eosinophils Absolute: 618 cells/uL — ABNORMAL HIGH (ref 15–500)
Eosinophils Relative: 7.1 %
HCT: 43.5 % (ref 35.0–45.0)
Hemoglobin: 14.8 g/dL (ref 11.7–15.5)
Lymphs Abs: 2401 cells/uL (ref 850–3900)
MCH: 30.1 pg (ref 27.0–33.0)
MCHC: 34 g/dL (ref 32.0–36.0)
MCV: 88.4 fL (ref 80.0–100.0)
MPV: 11.1 fL (ref 7.5–12.5)
Monocytes Relative: 8.7 %
Neutro Abs: 4907 cells/uL (ref 1500–7800)
Neutrophils Relative %: 56.4 %
Platelets: 416 10*3/uL — ABNORMAL HIGH (ref 140–400)
RBC: 4.92 10*6/uL (ref 3.80–5.10)
RDW: 12.1 % (ref 11.0–15.0)
Total Lymphocyte: 27.6 %
WBC: 8.7 10*3/uL (ref 3.8–10.8)

## 2022-12-26 LAB — COMPREHENSIVE METABOLIC PANEL
AG Ratio: 1.4 (calc) (ref 1.0–2.5)
ALT: 19 U/L (ref 6–29)
AST: 20 U/L (ref 10–30)
Albumin: 4.2 g/dL (ref 3.6–5.1)
Alkaline phosphatase (APISO): 71 U/L (ref 31–125)
BUN/Creatinine Ratio: 30 (calc) — ABNORMAL HIGH (ref 6–22)
BUN: 27 mg/dL — ABNORMAL HIGH (ref 7–25)
CO2: 25 mmol/L (ref 20–32)
Calcium: 9.3 mg/dL (ref 8.6–10.2)
Chloride: 103 mmol/L (ref 98–110)
Creat: 0.9 mg/dL (ref 0.50–0.96)
Globulin: 2.9 g/dL (calc) (ref 1.9–3.7)
Glucose, Bld: 79 mg/dL (ref 65–99)
Potassium: 4.1 mmol/L (ref 3.5–5.3)
Sodium: 138 mmol/L (ref 135–146)
Total Bilirubin: 0.2 mg/dL (ref 0.2–1.2)
Total Protein: 7.1 g/dL (ref 6.1–8.1)

## 2022-12-26 LAB — T4, FREE: Free T4: 1.1 ng/dL (ref 0.8–1.8)

## 2022-12-26 LAB — TSH: TSH: 1.4 mIU/L

## 2022-12-26 LAB — T3, FREE: T3, Free: 3.3 pg/mL (ref 2.3–4.2)

## 2023-01-14 ENCOUNTER — Ambulatory Visit: Payer: 59 | Admitting: General Surgery

## 2023-02-02 ENCOUNTER — Ambulatory Visit: Payer: 59 | Admitting: General Surgery

## 2023-02-02 ENCOUNTER — Encounter: Payer: Self-pay | Admitting: General Surgery

## 2023-02-02 VITALS — BP 108/66 | HR 79 | Temp 97.7°F | Resp 14 | Ht 71.0 in | Wt 180.0 lb

## 2023-02-02 DIAGNOSIS — K429 Umbilical hernia without obstruction or gangrene: Secondary | ICD-10-CM | POA: Diagnosis not present

## 2023-02-02 DIAGNOSIS — K439 Ventral hernia without obstruction or gangrene: Secondary | ICD-10-CM

## 2023-02-02 NOTE — Patient Instructions (Addendum)
Will discuss the case with other surgeons and call you to decide the best plan.   Open Hernia Repair, Adult Open hernia repair is a surgical procedure to fix a hernia. A hernia occurs when an internal organ or tissue pushes through a weak spot in the muscles along the wall of the abdomen. Hernias commonly occur in the groin and around the belly button. Most hernias tend to get worse over time. Often, surgery is done to prevent the hernia from becoming bigger, uncomfortable, or an emergency. Emergency surgery may be needed if contents of the abdomen get stuck in the opening (incarcerated hernia) or if the blood supply gets cut off (strangulated hernia). In an open repair, an incision is made in the abdomen to perform the surgery. Tell a health care provider about: Any allergies you have. All medicines you are taking, including vitamins, herbs, eye drops, creams, and over-the-counter medicines. Any problems you or family members have had with anesthetic medicines. Any blood or bone disorders you have. Any surgeries you have had. Any medical conditions you have, including any recent cold or flu (influenza)symptoms. Whether you are pregnant or may be pregnant. What are the risks? Generally, this is a safe procedure. However, problems may occur, including: Long-lasting (chronic) pain. Bleeding. Infection. Damage to the testicles. This can cause shrinking or swelling. Damage to nearby structures or organs, including the bladder, blood vessels, intestines, or nerves near the hernia. Blood clots. Trouble passing urine. Return of the hernia. What happens before the procedure? Laparoscopic Ventral Hernia Repair Laparoscopic ventral hernia repairis a procedure to fix a bulge of tissue that pushes through a weak area of muscle in the abdomen (ventral hernia). A ventral hernia may be: Above the belly button. This is called an epigastric hernia. At the belly button. This is called an umbilical  hernia. At the incision site from previous abdominal surgery. This is called an incisional hernia. You may have this procedure as emergency surgery if part of your intestine gets trapped inside the hernia and starts to lose its blood supply (strangulation). Laparoscopic surgery is done through small incisions using a thin surgical telescope with a light and camera (laparoscope). During surgery, your surgeon will use images from the laparoscope to guide the procedure. A mesh screen will be placed in the hernia to close the opening and strengthen the abdominal wall. Tell a health care provider about: Any allergies you have. All medicines you are taking, including vitamins, herbs, eye drops, creams, and over-the-counter medicines. Any problems you or family members have had with anesthetic medicines. Any blood disorders you have. Any surgeries you have had. Any medical conditions you have. Whether you are pregnant or may be pregnant. What are the risks? Generally, this is a safe procedure. However, problems may occur, including: Infection. Bleeding. Damage to nearby structures or organs in the abdomen. Trouble urinating or having a bowel movement after surgery. Blood clots. The hernia coming back after surgery. Fluid buildup in the area of the hernia. In some cases, your health care provider may need to switch from a laparoscopic procedure to a procedure that is done through a single, larger incision in the abdomen (open procedure). You may need an open procedure if: You have a hernia that is difficult to repair. Your organs are hard to see with the laparoscope. You have bleeding problems during the laparoscopic procedure. What happens before the procedure?  Medicines Ask your health care provider about: Changing or stopping your regular medicines. This is especially important  if you are taking diabetes medicines or blood thinners. Taking medicines such as aspirin and ibuprofen. These  medicines can thin your blood. Do not take these medicines unless your health care provider tells you to take them. Taking over-the-counter medicines, vitamins, herbs, and supplements. Tests You may need to have tests before the procedure, such as: Blood tests. Urine tests. Abdominal ultrasound. Chest X-ray. Electrocardiogram (ECG). General instructions You may be asked to take a laxative or do an enema to empty your bowel before surgery (bowel prep). Do not use any products that contain nicotine or tobacco for at least 4 weeks before the procedure. These products include cigarettes, chewing tobacco, and vaping devices, such as e-cigarettes. If you need help quitting, ask your health care provider. Ask your health care provider: How your surgery site will be marked. What steps will be taken to help prevent infection. These steps may include: Removing hair at the surgery site. Washing skin with a germ-killing soap. Receiving antibiotic medicine. Plan to have a responsible adult take you home from the hospital or clinic. If you will be going home right after the procedure, plan to have a responsible adult care for you for the time you are told. This is important. What happens during the procedure?  An IV will be inserted into one of your veins. You will be given one or more of the following: A medicine to help you relax (sedative). A medicine to numb the area (local anesthetic). A medicine to make you fall asleep (general anesthetic). A small incision will be made in your abdomen. A hollow metal tube (trocar) will be placed through the incision. A tube will be placed through the trocar to inflate your abdomen with carbon dioxide. This makes it easier for your surgeon to see inside your abdomen during the repair. A laparoscope will be inserted into your abdomen through the trocar. The laparoscope will send images to a monitor in the operating room. Other trocars will be put through other  small incisions in your abdomen. The surgical instruments needed for the procedure will be placed through these trocars. The tissue or intestines that make up the hernia will be moved back into place. The edges of the hernia may be stitched (sutured) together. A piece of mesh will be used to close the hernia. Sutures, clips, or staples will be used to keep the mesh in place. A bandage (dressing) or skin glue will be put over the incisions. The procedure may vary among health care providers and hospitals. What happens after the procedure? Your blood pressure, heart rate, breathing rate, and blood oxygen level will be monitored until you leave the hospital or clinic. You will continue to receive fluids and medicines through an IV. Your IV will be removed when you can drink clear fluids. You will be given pain medicine as needed. You will be encouraged to get up and walk around as soon as possible. You will be shown how to do deep breathing exercises to help prevent a lung infection. If you were given a sedative during the procedure, it can affect you for several hours. Do not drive or operate machinery until your health care provider says that it is safe. Summary Laparoscopic ventral hernia is an operation to fix a hernia using small incisions. Tell your health care provider about other medical conditions that you have and about all the medicines that you are taking. Follow instructions from your health care provider about eating and drinking before the procedure. Plan to  have a responsible adult take you home from the hospital or clinic. After the procedure, you will be encouraged to walk as soon as possible. You will also be taught how to do deep breathing exercises. This information is not intended to replace advice given to you by your health care provider. Make sure you discuss any questions you have with your health care provider. Document Revised: 04/19/2020 Document Reviewed:  04/19/2020 Elsevier Patient Education  2023 [employer] Medicines Ask your health care provider about: Changing or stopping your regular medicines. This is especially important if you are taking diabetes medicines or blood thinners. Taking medicines such as aspirin and ibuprofen. These medicines can thin your blood. Do not take these medicines unless your health care provider tells you to take them. Taking over-the-counter medicines, vitamins, herbs, and supplements. Surgery safety Ask your health care provider: How your surgery site will be marked. What steps will be taken to help prevent infection. These steps may include: Removing hair at the surgery site. Washing skin with a germ-killing soap. Receiving antibiotic medicine. General instructions You may have an exam or testing, such as blood tests or imaging studies. Do not use any products that contain nicotine or tobacco for at least 4 weeks before the procedure. These products include cigarettes, chewing tobacco, and vaping devices, such as e-cigarettes. If you need help quitting, ask your health care provider. Let your health care provider know if you develop a cold or any infection before your surgery. If you get an infection before surgery, you may receive antibiotics to treat it. Plan to have a responsible adult take you home from the hospital or clinic. If you will be going home right after the procedure, plan to have a responsible adult care for you for the time you are told. This is important. What happens during the procedure?  An IV will be inserted into one of your veins. You will be given one or more of the following: A medicine to help you relax (sedative). A medicine to numb the area (local anesthetic). A medicine to make you fall asleep (general anesthetic). Your surgeon will make an incision over the hernia. The tissues of the hernia will be moved back into place. The edges of the hernia may be stitched (sutured)  together. The opening in the abdominal muscles will be closed with stitches (sutures). Or, your surgeon will place a mesh patch made of artificial (synthetic) material over the opening. The incision will be closed with sutures, skin glue, or adhesive strips. A bandage (dressing) may be placed over the incision. The procedure may vary among health care providers and hospitals. What happens after the procedure? Your blood pressure, heart rate, breathing rate, and blood oxygen level will be monitored until you leave the hospital or clinic. You may be given medicine for pain. If you were given a sedative during the procedure, it can affect you for several hours. Do not drive or operate machinery until your health care provider says that it is safe. Summary Open hernia repair is a surgical procedure to fix a hernia. Hernias commonly occur in the groin and around the belly button. Emergency surgery may be needed if contents of the abdomen get stuck in the opening (incarcerated hernia) or if the blood supply gets cut off (strangulated hernia). In this procedure, an incision is made in the abdomen to perform the surgery. After the procedure, you may be given medicine for pain. This information is not intended to replace advice given to  you by your health care provider. Make sure you discuss any questions you have with your health care provider. Document Revised: 04/15/2020 Document Reviewed: 04/15/2020 Elsevier Patient Education  2023 [employer].

## 2023-02-02 NOTE — Progress Notes (Unsigned)
[facility] History and Physical  Reason for Referral: Umbilical hernia  Referring Physician: Jeoffrey Massed, MD   Chief Complaint   New Patient (Initial Visit)     Jacqueline [name] is a 22 y.o. female.  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 Jacqueline [name] had a recent show where Jacqueline [name] had to cut and was able to notice a small bulge in her epigastric region that reduced with palpation. Jacqueline [name] says that this area has started to feel painful with overhead presses and Jacqueline [name] can feel a pulling of the area with upward motion. Jacqueline [name] also has noticed a bulge at the umbilical area but this does not cause her any discomfort at this time and Jacqueline [name] has not had to push anything back in.  Jacqueline [name] had a prior inguinal hernia repair as a child and has a small right sided incision. I was able to find the report and this was done in 2009 by Dr. Cyd Silence as an open hernia repair with diagnostic laparoscopy to ensure no left sided defect. He then did a high ligation of the sac and closed the external oblique fascia with interrupted Vicryl sutures per his documentation. This was a tissue repair and no mesh was used. Jacqueline [name] has had no issues.   Past Medical History:  Diagnosis Date   Acute low back pain 2018   Responding to PT well as of 01/2017   Mastoiditis    Scoliosis of lumbar spine    mild levoscoliosis on L/S x-ray 11/2016.    Past Surgical History:  Procedure Laterality Date   INGUINAL HERNIA REPAIR  approx 2010   Right side (Dr. Wyline Mood).    Family History  Problem Relation Age of Onset   Melanoma Mother    Lung cancer Maternal Grandmother        smoker   Breast cancer Paternal Grandmother     Social History   Tobacco Use   Smoking status: Never   Smokeless tobacco: Never  Vaping Use   Vaping Use: Never used  Substance Use Topics   Alcohol use: No   Drug use: No    Medications: I have reviewed  the patient's current medications. Allergies as of 02/02/2023       Reactions   Penicillins Hives        Medication List        Accurate as of Feb 02, 2023 11:59 PM. If you have any questions, ask your nurse or doctor.          desvenlafaxine 50 MG 24 hr tablet Commonly known as: PRISTIQ Take by mouth.   DULoxetine 30 MG capsule Commonly known as: CYMBALTA Take 1 capsule (30 mg total) by mouth daily.   ibuprofen 100 MG tablet Commonly known as: ADVIL Take 300 mg by mouth every 6 (six) hours as needed.   MULTIVITAMIN PO Take by mouth daily.   propranolol 10 MG tablet Commonly known as: INDERAL TAKE 1-2 TABLETS BY MOUTH 1 HOUR PRIOR TO ANXIETY INDUCING ACTIVITIES.         ROS:  A comprehensive review of systems was negative except for: Gastrointestinal: positive for abdominal pain  Blood pressure 108/66, pulse 79, temperature 97.7 F (36.5 C), temperature source Oral, resp. rate 14, height 5\' 11"  (1.803 m), weight 180 lb (81.6 kg), SpO2 98 %. Physical Exam Vitals reviewed.  Constitutional:      Appearance: Normal  appearance.  HENT:     Head: Normocephalic.     Nose: Nose normal.  Eyes:     Extraocular Movements: Extraocular movements intact.  Cardiovascular:     Rate and Rhythm: Normal rate and regular rhythm.  Pulmonary:     Effort: Pulmonary effort is normal.     Breath sounds: Normal breath sounds.  Abdominal:     General: There is no distension.     Palpations: Abdomen is soft.     Tenderness: There is abdominal tenderness.     Hernia: A hernia is present.     Comments: Epigastric hernia, reducible fat, approximately 0.5cm, and umbilical hernia central in the umbilicus, 0.5cm defect, No diastasis recti, with her rectus separation being 1cm or less from epigastric region down to her umbilicus, well defined rectus muscles   Musculoskeletal:        General: No swelling. Normal range of motion.     Cervical back: Normal range of motion.  Skin:     General: Skin is warm.  Neurological:     General: No focal deficit present.     Mental Status: Jacqueline [name] is alert and oriented to person, place, and time.  Psychiatric:        Mood and Affect: Mood normal.        Behavior: Behavior normal.        Thought Content: Thought content normal.        Judgment: Judgment normal.     Results: None    Assessment & Plan:  Jacqueline [name] is a 22 y.o. female with two very small epigastric and umbilical hernias both of which I think are less than 0.5cm in size. The epigastric very well could just be preperitoneal fat. Jacqueline [name] likely has had these for a long time if not from birth given the location and size and are just causing a problem because of her lifting and her cutting phase where her body fat percentage decreased and Jacqueline [name] could see it.  Jacqueline [name] is having discomfort from this epigastric hernia.   Her situation is different than most people in that Jacqueline [name] does lift and is a bodybuilder doing competitions. Jacqueline [name] is not concerned about any skin scars or hiding this. Discussed that in someone that did not lift, I think these hernias are so small that they can be repaired primarily with suture in attempts to not burn any Icker Swigert for any future hernia repair if there is a recurrence.  I have discussed with her that I want to discuss her case with others and to discuss her lifting and deciding the best option for her. I have a sense that again less will be more with the understanding that with her lifting Jacqueline [name] does have a risk of recurrence. I do not feel that these areas are large enough to need a mesh and opening it up more can create more problems to get a mesh in place. I also do not think placing robotic/laparoscopic approaches are indicated as we would be placing 3 -8mm ports to fix two 0.6mm areas and run the risk of creating other areas of hernia.   Discussed that I will discuss her case and get a consensus of people and call her to discuss things further. Jacqueline [name] is aware  Jacqueline [name] would not be able to lift for 6-8 weeks and Jacqueline [name] does not have an event for 1 year.    All questions were answered to the satisfaction of the patient.    Jacqueline [name]  02/03/2023, 12:21 PM

## 2023-02-12 ENCOUNTER — Telehealth (INDEPENDENT_AMBULATORY_CARE_PROVIDER_SITE_OTHER): Payer: 59 | Admitting: General Surgery

## 2023-02-12 DIAGNOSIS — K429 Umbilical hernia without obstruction or gangrene: Secondary | ICD-10-CM

## 2023-02-12 DIAGNOSIS — K439 Ventral hernia without obstruction or gangrene: Secondary | ICD-10-CM

## 2023-02-12 NOTE — Telephone Encounter (Signed)
Noted  

## 2023-02-12 NOTE — Telephone Encounter (Signed)
[name]  Called patient. I have polled 10 surgeons regarding the best repair for the patient. Given the small defects 1/2 cm in size and Jacqueline [name] healthy tissue. 9/10 including myself recommend a simple primary repair realizing she is at risk of recurrence in the future given lifting and potential pregnancies. This option does not burn any Jacqueline [name] for future repairs with mesh that could be done robotically and realistically it could be years before this is an issues.  Discussed scheduling and calling the office once she decides. Discussed no lifting for 6-8 weeks given Jacqueline [name] time to heal and no swimming submerging for about 2 weeks to let the skin heal.  She will think about it and let the office know if she decides to pursue repair.   Algis Greenhouse, MD Jacqueline [name] [address] Jacqueline [name] [place], [place] [address] [phone] (office)

## 2023-03-17 ENCOUNTER — Telehealth: Payer: Self-pay

## 2023-03-17 NOTE — Telephone Encounter (Signed)
LVM for pt to return call.  Contacted patient to see if she would like to schedule for yearly physical, last physical 03/20/20.
